# Patient Record
Sex: Female | Born: 1945 | Race: White | Hispanic: No | Marital: Married | Smoking: Never smoker
Health system: Southern US, Community
[De-identification: ages and names within clinical notes are randomized; demographics above are authoritative.]

## PROBLEM LIST (undated history)

## (undated) DIAGNOSIS — G309 Alzheimer's disease, unspecified: Secondary | ICD-10-CM

## (undated) DIAGNOSIS — F0280 Dementia in other diseases classified elsewhere without behavioral disturbance: Secondary | ICD-10-CM

## (undated) DIAGNOSIS — M810 Age-related osteoporosis without current pathological fracture: Secondary | ICD-10-CM

## (undated) DIAGNOSIS — R413 Other amnesia: Secondary | ICD-10-CM

## (undated) HISTORY — DX: Dementia in other diseases classified elsewhere without behavioral disturbance: F02.80

## (undated) HISTORY — PX: NO PAST SURGERIES: SHX2092

## (undated) HISTORY — DX: Alzheimer's disease, unspecified: G30.9

## (undated) HISTORY — DX: Age-related osteoporosis without current pathological fracture: M81.0

## (undated) HISTORY — DX: Other amnesia: R41.3

---

## 1999-05-24 ENCOUNTER — Other Ambulatory Visit: Admission: RE | Admit: 1999-05-24 | Discharge: 1999-05-24 | Payer: Self-pay | Admitting: Family Medicine

## 1999-06-21 ENCOUNTER — Encounter: Admission: RE | Admit: 1999-06-21 | Discharge: 1999-06-21 | Payer: Self-pay | Admitting: Family Medicine

## 1999-06-21 ENCOUNTER — Encounter: Payer: Self-pay | Admitting: Family Medicine

## 1999-06-24 ENCOUNTER — Encounter: Payer: Self-pay | Admitting: Family Medicine

## 1999-06-24 ENCOUNTER — Encounter: Admission: RE | Admit: 1999-06-24 | Discharge: 1999-06-24 | Payer: Self-pay | Admitting: Family Medicine

## 2000-02-11 ENCOUNTER — Encounter: Admission: RE | Admit: 2000-02-11 | Discharge: 2000-02-11 | Payer: Self-pay | Admitting: Family Medicine

## 2000-02-11 ENCOUNTER — Encounter (INDEPENDENT_AMBULATORY_CARE_PROVIDER_SITE_OTHER): Payer: Self-pay | Admitting: *Deleted

## 2000-02-11 ENCOUNTER — Other Ambulatory Visit: Admission: RE | Admit: 2000-02-11 | Discharge: 2000-02-11 | Payer: Self-pay | Admitting: Radiology

## 2000-02-11 ENCOUNTER — Encounter: Payer: Self-pay | Admitting: Family Medicine

## 2000-08-08 ENCOUNTER — Other Ambulatory Visit: Admission: RE | Admit: 2000-08-08 | Discharge: 2000-08-08 | Payer: Self-pay | Admitting: Family Medicine

## 2000-08-10 ENCOUNTER — Encounter: Admission: RE | Admit: 2000-08-10 | Discharge: 2000-08-10 | Payer: Self-pay | Admitting: Family Medicine

## 2000-08-10 ENCOUNTER — Encounter: Payer: Self-pay | Admitting: Family Medicine

## 2000-11-10 ENCOUNTER — Ambulatory Visit (HOSPITAL_COMMUNITY): Admission: RE | Admit: 2000-11-10 | Discharge: 2000-11-10 | Payer: Self-pay | Admitting: Gastroenterology

## 2001-09-26 ENCOUNTER — Other Ambulatory Visit: Admission: RE | Admit: 2001-09-26 | Discharge: 2001-09-26 | Payer: Self-pay | Admitting: Family Medicine

## 2001-10-01 ENCOUNTER — Encounter: Admission: RE | Admit: 2001-10-01 | Discharge: 2001-10-01 | Payer: Self-pay | Admitting: Family Medicine

## 2001-10-01 ENCOUNTER — Encounter: Payer: Self-pay | Admitting: Family Medicine

## 2001-10-04 ENCOUNTER — Encounter: Payer: Self-pay | Admitting: Family Medicine

## 2001-10-04 ENCOUNTER — Encounter: Admission: RE | Admit: 2001-10-04 | Discharge: 2001-10-04 | Payer: Self-pay | Admitting: Family Medicine

## 2002-10-09 ENCOUNTER — Encounter: Payer: Self-pay | Admitting: Family Medicine

## 2002-10-09 ENCOUNTER — Encounter: Admission: RE | Admit: 2002-10-09 | Discharge: 2002-10-09 | Payer: Self-pay | Admitting: Family Medicine

## 2002-10-25 ENCOUNTER — Other Ambulatory Visit: Admission: RE | Admit: 2002-10-25 | Discharge: 2002-10-25 | Payer: Self-pay | Admitting: Family Medicine

## 2003-01-30 ENCOUNTER — Ambulatory Visit (HOSPITAL_COMMUNITY): Admission: RE | Admit: 2003-01-30 | Discharge: 2003-01-30 | Payer: Self-pay | Admitting: Gastroenterology

## 2003-01-30 ENCOUNTER — Encounter (INDEPENDENT_AMBULATORY_CARE_PROVIDER_SITE_OTHER): Payer: Self-pay | Admitting: Specialist

## 2003-10-10 ENCOUNTER — Ambulatory Visit (HOSPITAL_COMMUNITY): Admission: RE | Admit: 2003-10-10 | Discharge: 2003-10-10 | Payer: Self-pay | Admitting: Family Medicine

## 2003-11-25 ENCOUNTER — Other Ambulatory Visit: Admission: RE | Admit: 2003-11-25 | Discharge: 2003-11-25 | Payer: Self-pay | Admitting: Family Medicine

## 2004-10-28 ENCOUNTER — Ambulatory Visit (HOSPITAL_COMMUNITY): Admission: RE | Admit: 2004-10-28 | Discharge: 2004-10-28 | Payer: Self-pay | Admitting: Family Medicine

## 2005-11-04 ENCOUNTER — Ambulatory Visit (HOSPITAL_COMMUNITY): Admission: RE | Admit: 2005-11-04 | Discharge: 2005-11-04 | Payer: Self-pay | Admitting: Family Medicine

## 2006-03-16 ENCOUNTER — Other Ambulatory Visit: Admission: RE | Admit: 2006-03-16 | Discharge: 2006-03-16 | Payer: Self-pay | Admitting: Family Medicine

## 2006-11-15 ENCOUNTER — Ambulatory Visit (HOSPITAL_COMMUNITY): Admission: RE | Admit: 2006-11-15 | Discharge: 2006-11-15 | Payer: Self-pay | Admitting: Family Medicine

## 2007-11-23 ENCOUNTER — Ambulatory Visit (HOSPITAL_COMMUNITY): Admission: RE | Admit: 2007-11-23 | Discharge: 2007-11-23 | Payer: Self-pay | Admitting: Family Medicine

## 2008-12-01 ENCOUNTER — Encounter: Admission: RE | Admit: 2008-12-01 | Discharge: 2008-12-01 | Payer: Self-pay | Admitting: Family Medicine

## 2009-12-04 ENCOUNTER — Ambulatory Visit (HOSPITAL_COMMUNITY): Admission: RE | Admit: 2009-12-04 | Discharge: 2009-12-04 | Payer: Self-pay | Admitting: Family Medicine

## 2010-04-26 ENCOUNTER — Encounter: Payer: Self-pay | Admitting: Family Medicine

## 2010-08-20 NOTE — Op Note (Signed)
NAMEJENEVA, Sheri Lucas                            ACCOUNT NO.:  [id number]   MEDICAL RECORD NO.:  [id number]                   PATIENT TYPE:  AMB   LOCATION:  ENDO                                 FACILITY:  [facility]   PHYSICIAN:  Petra Kuba, M.D.                 DATE OF BIRTH:  Jul 13, 1945   DATE OF PROCEDURE:  01/30/2003  DATE OF DISCHARGE:                                 OPERATIVE REPORT   PROCEDURE:  Colonoscopy with biopsy.   INDICATIONS FOR PROCEDURE:  Guaiac positivity, family history of colon  cancer not quite due for colonic screening.   Consent was signed after risks, benefits, methods, and options were  thoroughly discussed in the office on multiple occasions.   MEDICINES USED:  Demerol 60, Versed 6.   DESCRIPTION OF PROCEDURE:  Rectal inspection was pertinent for small  external hemorrhoids. Digital exam was negative. The pediatric video  adjustable colonoscope was inserted, easily advanced around the colon to the  cecum. This did require some abdominal pressure but no position changes. No  obvious abnormality was seen on insertion. The cecum was identified by the  appendiceal orifice and the ileocecal valve. In fact, the scope was inserted  a short ways into the terminal ileum which was normal. Photo documentation  was obtained. No blood was seen on insertion or coming from above. The scope  was slowly withdrawn.  The prep was adequate. There was some liquid stool  that required washing and suctioning. In the cecal pole along the pole the  tiny 1-2 mm probably hyperplastic appearing polyp was seen and was cold  biopsied x2.  On slow withdrawal through the colon, no other abnormalities  were seen including no blood, diverticula, AVMs or polyps.  Anal rectal pull  through and retroflexion did confirm small hemorrhoids. The scope was  reinserted a short ways up the left side of the colon, air was suctioned,  scope removed. The patient tolerated the procedure well. There was  no  obvious or immediate complications.   ENDOSCOPIC DIAGNOSIS:  1. Internal and external hemorrhoids.  2. No blood seen.  3. Tiny cecal polyp cold biopsied.  4. Otherwise within normal limits to the terminal ileum.    PLAN:  Await pathology. Would probably recheck colon screening in five  years. Will go ahead and plan to treat her hemorrhoids and repeat guaiacs in  one month and if positive continue workup with an upper GI small bowel  follow through and a CBC; however, if negative probably no further workup.  Return care to Dr. Idell Pickles and be on standby to help p.r.n.                                               Petra Kuba, M.D.  MEM/MEDQ  D:  01/30/2003  T:  01/30/2003  Job:  [id number]   cc:   Dellis Anes. Idell Pickles, M.D.  [address]  [place]  [place] [address]  Fax: [phone]

## 2010-08-20 NOTE — Procedures (Signed)
[facility]  Patient:    Sheri Lucas, Sheri Lucas                       MRN: [id number] Proc. Date: 11/10/00 Adm. Date:  [id number] Attending:  Nelda Marseille CC:         Dellis Anes. Idell Pickles, M.D.   Procedure Report  PROCEDURE:  Colonoscopy.  INDICATIONS:  Family history of colon cancer.  PROCEDURE PERFORMED:  Consent was signed after the risks, benefits, methods, options were thoroughly discussed in the office.  MEDICINE USED:  Demerol 40 and Versed 4.  PROCEDURE: Rectal inspection was pertinent for external hemorrhoids.  Small digital exam was negative.  The video pediatric colonoscope was inserted and advanced around the colon to the cecum.  This did require some left lower quadrant pressure but no position changes.  The cecum was identified by the appendiceal orifice and the ileocecal valve.  No obvious abnormality was seen on insertion.  The prep was adequate.  There was some liquid stool that required washing and sectioning, but on slow withdrawal through the colon, no polyps, masses, diverticula, or other abnormalities were seen as we slowly withdrew back to the rectum.  Once back in the rectum, the scope was then retroflexed and pertinent for some internal hemorrhoids.  The scope was drained.  The air was withdrawn, and the scope was removed.  The patient tolerated the procedure well.  There was no obvious immediate complication.  ENDOSCOPIC DIAGNOSES: 1. Internal and external hemorrhoids. 2. Otherwise within normal limits to the cecum.  PLAN: Yearly rectals and guaiacs per Dellis Anes. Idell Pickles, M.D.  Happy to see back p.r.n. Otherwise, repeat colon screening in five years. DD:  11/10/00 TD:  11/11/00 Job: [id number]

## 2010-12-28 ENCOUNTER — Other Ambulatory Visit (HOSPITAL_COMMUNITY): Payer: Self-pay | Admitting: Family Medicine

## 2010-12-28 DIAGNOSIS — Z1231 Encounter for screening mammogram for malignant neoplasm of breast: Secondary | ICD-10-CM

## 2011-01-07 ENCOUNTER — Ambulatory Visit (HOSPITAL_COMMUNITY)
Admission: RE | Admit: 2011-01-07 | Discharge: 2011-01-07 | Disposition: A | Discharge status: Home / Self Care | Payer: 59 | Source: Ambulatory Visit | Attending: Family Medicine | Admitting: Family Medicine

## 2011-01-07 DIAGNOSIS — Z1231 Encounter for screening mammogram for malignant neoplasm of breast: Secondary | ICD-10-CM

## 2011-12-30 ENCOUNTER — Other Ambulatory Visit (HOSPITAL_COMMUNITY): Payer: Self-pay | Admitting: Family Medicine

## 2011-12-30 DIAGNOSIS — Z1231 Encounter for screening mammogram for malignant neoplasm of breast: Secondary | ICD-10-CM

## 2012-01-20 ENCOUNTER — Ambulatory Visit (HOSPITAL_COMMUNITY)
Admission: RE | Admit: 2012-01-20 | Discharge: 2012-01-20 | Disposition: A | Discharge status: Home / Self Care | Payer: Medicare PPO | Source: Ambulatory Visit | Attending: Family Medicine | Admitting: Family Medicine

## 2012-01-20 DIAGNOSIS — Z1231 Encounter for screening mammogram for malignant neoplasm of breast: Secondary | ICD-10-CM | POA: Insufficient documentation

## 2012-12-28 ENCOUNTER — Other Ambulatory Visit (HOSPITAL_COMMUNITY): Payer: Self-pay | Admitting: Diagnostic Radiology

## 2012-12-28 DIAGNOSIS — Z1231 Encounter for screening mammogram for malignant neoplasm of breast: Secondary | ICD-10-CM

## 2013-01-25 ENCOUNTER — Ambulatory Visit (HOSPITAL_COMMUNITY)
Admission: RE | Admit: 2013-01-25 | Discharge: 2013-01-25 | Disposition: A | Discharge status: Home / Self Care | Payer: Medicare PPO | Source: Ambulatory Visit | Attending: Diagnostic Radiology | Admitting: Diagnostic Radiology

## 2013-01-25 ENCOUNTER — Other Ambulatory Visit (HOSPITAL_COMMUNITY): Payer: Self-pay | Admitting: Diagnostic Radiology

## 2013-01-25 DIAGNOSIS — Z1231 Encounter for screening mammogram for malignant neoplasm of breast: Secondary | ICD-10-CM

## 2014-01-03 ENCOUNTER — Other Ambulatory Visit (HOSPITAL_COMMUNITY): Payer: Self-pay | Admitting: Family Medicine

## 2014-01-03 DIAGNOSIS — Z1231 Encounter for screening mammogram for malignant neoplasm of breast: Secondary | ICD-10-CM

## 2014-01-28 ENCOUNTER — Ambulatory Visit (HOSPITAL_COMMUNITY)
Admission: RE | Admit: 2014-01-28 | Discharge: 2014-01-28 | Disposition: A | Discharge status: Home / Self Care | Payer: Medicare PPO | Source: Ambulatory Visit | Attending: Family Medicine | Admitting: Family Medicine

## 2014-01-28 DIAGNOSIS — Z1231 Encounter for screening mammogram for malignant neoplasm of breast: Secondary | ICD-10-CM | POA: Insufficient documentation

## 2015-01-13 ENCOUNTER — Ambulatory Visit (INDEPENDENT_AMBULATORY_CARE_PROVIDER_SITE_OTHER): Payer: Medicare Other | Admitting: Neurology

## 2015-01-13 ENCOUNTER — Encounter: Payer: Self-pay | Admitting: Neurology

## 2015-01-13 VITALS — BP 153/81 | HR 85 | Ht 61.0 in | Wt 104.5 lb

## 2015-01-13 DIAGNOSIS — R413 Other amnesia: Secondary | ICD-10-CM

## 2015-01-13 HISTORY — DX: Other amnesia: R41.3

## 2015-01-13 MED ORDER — DONEPEZIL HCL 5 MG PO TABS: 5.0000 mg | ORAL_TABLET | Freq: Every day | ORAL | Status: DC

## 2015-01-13 NOTE — Progress Notes (Signed)
Reason for visit: Memory disorder  Referring physician: Dr. Rhae Hammock Sheri Lucas is a 69 y.o. female  History of present illness:  Sheri Lucas is a 69 year old right-handed white female with a history of a memory disorder that began about 18 months ago. The patient indicates that the memory issues were first noted shortly after she retired. She was not having memory problems while working. She has had some issues with short-term memory, she denies any issues with remembering names for people or things. She is having a lot of problems with math functions and numbers. She has had to get some help balancing her checkbook over the last 6-8 months. She is still operating a motor vehicle, she denies any issues with directions or with safety while driving. Her husband concurs with this. The patient is able to keep up with medications and appointments. She denies any balance issues or problems controlling the bowels or the bladder. She denies any numbness or weakness of the face, arms, or legs. She denies any depression problems or fatigue. She denies any family history of memory problems with exception that her father may have had some mild memory problems as he got older. The patient has had blood work done that includes a thyroid profile, and a vitamin B12 level that was unremarkable.  Past Medical History  Diagnosis Date  . Osteoporosis   . Memory deficits 01/13/2015    History reviewed. No pertinent past surgical history.  Family History  Problem Relation Age of Onset  . Heart attack Father   . Pancreatic cancer Brother     Social history:  reports that she has never smoked. She has never used smokeless tobacco. She reports that she does not drink alcohol or use illicit drugs.  Medications:  Prior to Admission medications   Medication Sig Start Date End Date Taking? Authorizing Provider  Calcium Carbonate-Vit D-Min (CALCIUM/VITAMIN D/MINERALS) 600-200 MG-UNIT TABS Take 1 tablet by  mouth daily.   Yes Historical Provider, MD  Multiple Vitamin (MULTIVITAMIN) tablet Take 1 tablet by mouth daily.   Yes Historical Provider, MD      Allergies  Allergen Reactions  . Amoxicillin   . Keflex [Cephalexin]     ROS:  Out of a complete 14 system review of symptoms, the patient complains only of the following symptoms, and all other reviewed systems are negative.  Memory disturbance, confusion  Blood pressure 153/81, pulse 85, height  (1.549 m), weight 104 lb 8 oz (47.401 kg).  Physical Exam  General: The patient is alert and cooperative at the time of the examination.  Eyes: Pupils are equal, round, and reactive to light. Discs are flat bilaterally.  Neck: The neck is supple, no carotid bruits are noted.  Respiratory: The respiratory examination is clear.  Cardiovascular: The cardiovascular examination reveals a regular rate and rhythm, no obvious murmurs or rubs are noted.  Skin: Extremities are without significant edema.  Neurologic Exam  Mental status: The patient is alert and oriented x 2 at the time of the examination (the patient is not oriented to date). The Mini-Mental Status Examination done today shows a total score 21/30. The patient is able to name 4 animals in 30 seconds.  Cranial nerves: Facial symmetry is present. There is good sensation of the face to pinprick and soft touch bilaterally. The strength of the facial muscles and the muscles to head turning and shoulder shrug are normal bilaterally. Speech is well enunciated, no aphasia or dysarthria is noted.  Extraocular movements are full. Visual fields are full. The tongue is midline, and the patient has symmetric elevation of the soft palate. No obvious hearing deficits are noted.  Motor: The motor testing reveals 5 over 5 strength of all 4 extremities. Good symmetric motor tone is noted throughout.  Sensory: Sensory testing is intact to pinprick, soft touch, vibration sensation, and position  sense on all 4 extremities. No evidence of extinction is noted.  Coordination: Cerebellar testing reveals good finger-nose-finger and heel-to-shin bilaterally. The patient has apraxia his with use of the arms and the legs.  Gait and station: Gait is normal. Tandem gait is normal. Romberg is negative. No drift is seen.  Reflexes: Deep tendon reflexes are symmetric and normal bilaterally. Toes are downgoing bilaterally.   Assessment/Plan:  1. Progressive memory disturbance  The patient likely has been developing an Alzheimer's disease process. 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 She has had blood work done that has been unremarkable, she will be sent for MRI evaluation of the brain. The husband asked about her driving, she has not had any issues with safety so far, but this issue needs to be scrutinized closely, the patient will need to refrain from driving as soon as problems are noted. The patient will follow-up in about 6 months, sooner if needed. We will start Aricept therapy now.  Marlan Palau MD 01/13/2015 6:56 PM  [name] [address] [place], [place] [address]  Phone [phone] Fax [phone]

## 2015-01-13 NOTE — Patient Instructions (Signed)
   Begin Aricept (donepezil) at 5 mg at night for one month. If this medication is well-tolerated, please call our office and we will call in a prescription for the 10 mg tablets. Look out for side effects that may include nausea, diarrhea, weight loss, or stomach cramps. This medication will also cause a runny nose, therefore there is no need for allergy medications for this purpose.  

## 2015-01-25 ENCOUNTER — Ambulatory Visit
Admission: RE | Admit: 2015-01-25 | Discharge: 2015-01-25 | Disposition: A | Discharge status: Home / Self Care | Payer: Medicare Other | Source: Ambulatory Visit | Attending: Neurology | Admitting: Neurology

## 2015-01-25 DIAGNOSIS — R413 Other amnesia: Secondary | ICD-10-CM

## 2015-01-26 ENCOUNTER — Telehealth: Payer: Self-pay | Admitting: Neurology

## 2015-01-26 NOTE — Telephone Encounter (Signed)
I called the patient, talk with the husband as well. MRI shows some atrophy, very minimal small vessel disease. The patient is on Aricept at 5 mg, tolerating this well, they will call me when she has been on this one month, we will go up to the 10 mg dose.   MRI brain 01/26/2015:  IMPRESSION: This is an abnormal MRI of the brain without contrast showing the following: 1. Moderate cortical atrophy that is most pronounced in the mesial temporal lobes and the parietal lobes. 2. A few scattered T2/FLAIR hyperintense foci, consistent with age appropriate minimal chronic microvascular ischemic change 3. There are no acute findings.

## 2015-02-09 ENCOUNTER — Telehealth: Payer: Self-pay | Admitting: Neurology

## 2015-02-09 MED ORDER — DONEPEZIL HCL 10 MG PO TABS: 10.0000 mg | ORAL_TABLET | Freq: Every day | ORAL | Status: DC

## 2015-02-09 NOTE — Telephone Encounter (Signed)
Pt's husband called sts she is doing well on donepezil (ARICEPT) 5 MG tablet and sts Dr Anne HahnWillis advised could go up on dose if she was tolerating it. [facility].

## 2015-02-09 NOTE — Telephone Encounter (Signed)
Per last OV note: Begin Aricept (donepezil) at 5 mg at night for one month. If this medication is well-tolerated, please call our office and we will call in a prescription for the 10 mg tablets.  Rx has been updated and sent.

## 2015-02-13 ENCOUNTER — Other Ambulatory Visit: Payer: Self-pay

## 2015-02-13 MED ORDER — DONEPEZIL HCL 10 MG PO TABS: 10.0000 mg | ORAL_TABLET | Freq: Every day | ORAL | Status: DC

## 2015-03-09 ENCOUNTER — Telehealth: Payer: Self-pay | Admitting: Neurology

## 2015-03-09 ENCOUNTER — Encounter: Payer: Self-pay | Admitting: Neurology

## 2015-03-09 NOTE — Telephone Encounter (Signed)
The doctors prescribed Aricept 5mg  and then went to a 10 mg dosage. Does the dosage need to be increased when refilled. Also the patient got a summons for jury duty on Janauary 10th and needs a note to release her from this.  Thanks!  Please call.

## 2015-03-09 NOTE — Telephone Encounter (Signed)
I called the patient's husband. I advised that the patient will remain on 10 mg of Aricept as long as she is tolerating it well. He stated that she is tolerating it fine. I also advised that I would send this message to Dr. Anne HahnWillis for the jury duty letter and we would call him back.

## 2015-03-09 NOTE — Telephone Encounter (Signed)
I called the patient, talk with her husband. The patient is to remain on 10 mg of Aricept, I'll write a note for the jury duty exclusion.

## 2015-04-09 ENCOUNTER — Other Ambulatory Visit: Payer: Self-pay

## 2015-04-09 DIAGNOSIS — Z1231 Encounter for screening mammogram for malignant neoplasm of breast: Secondary | ICD-10-CM

## 2015-04-28 ENCOUNTER — Ambulatory Visit
Admission: RE | Admit: 2015-04-28 | Discharge: 2015-04-28 | Disposition: A | Discharge status: Home / Self Care | Payer: Medicare Other | Source: Ambulatory Visit

## 2015-04-28 DIAGNOSIS — Z1231 Encounter for screening mammogram for malignant neoplasm of breast: Secondary | ICD-10-CM

## 2015-06-16 ENCOUNTER — Other Ambulatory Visit: Payer: Self-pay | Admitting: Neurology

## 2015-07-20 ENCOUNTER — Encounter: Payer: Self-pay | Admitting: Neurology

## 2015-07-20 ENCOUNTER — Ambulatory Visit (INDEPENDENT_AMBULATORY_CARE_PROVIDER_SITE_OTHER): Payer: Medicare Other | Admitting: Neurology

## 2015-07-20 VITALS — BP 140/75 | HR 65 | Ht 61.0 in | Wt 103.5 lb

## 2015-07-20 DIAGNOSIS — R413 Other amnesia: Secondary | ICD-10-CM

## 2015-07-20 NOTE — Progress Notes (Signed)
    Reason for visit: Memory disorder  Sheri Lucas is an 70 y.o. female  History of present illness:  Sheri Lucas is a 70 year old right-handed white female with a history of a memory disorder. The patient has been placed on Aricept and she is tolerating the medication well. She has had some minimal weight loss on the medication, but overall she is lost only 1 pound since her last visit. She is exercising on a regular basis. She has not given up any activities of daily living because of memory. She does have some word finding problems primarily. The patient returns to this office for an evaluation. She indicates that she is sleeping fairly well.  Past Medical History  Diagnosis Date  . Osteoporosis   . Memory deficits 01/13/2015    History reviewed. No pertinent past surgical history.  Family History  Problem Relation Age of Onset  . Heart attack Father   . Pancreatic cancer Brother     Social history:  reports that she has never smoked. She has never used smokeless tobacco. She reports that she does not drink alcohol or use illicit drugs.    Allergies  Allergen Reactions  . Amoxicillin   . Keflex [Cephalexin]     Medications:  Prior to Admission medications   Medication Sig Start Date End Date Taking? Authorizing Provider  Calcium Carbonate-Vit D-Min (CALCIUM/VITAMIN D/MINERALS) 600-200 MG-UNIT TABS Take 1 tablet by mouth daily.    Historical Provider, MD  donepezil (ARICEPT) 10 MG tablet TAKE 1 TABLET (10 MG TOTAL) BY MOUTH AT BEDTIME. 06/16/15   York Spanielharles K Willis, MD  Multiple Vitamin (MULTIVITAMIN) tablet Take 1 tablet by mouth daily.    Historical Provider, MD    ROS:  Out of a complete 14 system review of symptoms, the patient complains only of the following symptoms, and all other reviewed systems are negative.  Memory disturbance  Blood pressure 140/75, pulse 65, height 5\' 1"  (1.549 m), weight 103 lb 8 oz (46.947 kg).  Physical Exam  General: The patient is  alert and cooperative at the time of the examination.  Skin: No significant peripheral edema is noted.   Neurologic Exam  Mental status: The patient is alert and oriented x 2 at the time of the examination (not oriented to date). The Mini-Mental Status Examination done today shows a total score of 26/30. The patient is able to name 9 animals in 30 seconds.   Cranial nerves: Facial symmetry is present. Speech is normal, no aphasia or dysarthria is noted. Extraocular movements are full. Visual fields are full.  Motor: The patient has good strength in all 4 extremities.  Sensory examination: Soft touch sensation is symmetric on the face, arms, and legs.  Coordination: The patient has good finger-nose-finger and heel-to-shin bilaterally.  Gait and station: The patient has a normal gait. Tandem gait is normal. Romberg is negative. No drift is seen.  Reflexes: Deep tendon reflexes are symmetric.   Assessment/Plan:  1. Memory disturbance  The patient is tolerating the Aricept fairly well. She will continue on the medication. The patient is interested in learning about research programs for memory issues. I will have our research coordinator to contact her. She will follow-up in 6 or 7 months.  Marlan Palau. Keith Willis MD 07/20/2015 8:09 PM  [name] [address] [place], [place] [id number]  Phone [phone] Fax [phone]

## 2015-10-02 ENCOUNTER — Telehealth: Payer: Self-pay | Admitting: *Deleted

## 2015-10-02 MED ORDER — DONEPEZIL HCL 10 MG PO TABS: 10.0000 mg | ORAL_TABLET | Freq: Every day | ORAL | Status: DC

## 2015-10-02 NOTE — Telephone Encounter (Signed)
[facility] day rx. [facility]fim

## 2016-02-22 ENCOUNTER — Ambulatory Visit (INDEPENDENT_AMBULATORY_CARE_PROVIDER_SITE_OTHER): Payer: Medicare Other | Admitting: Neurology

## 2016-02-22 ENCOUNTER — Encounter: Payer: Self-pay | Admitting: Neurology

## 2016-02-22 VITALS — BP 161/84 | HR 72 | Ht 61.0 in | Wt 104.2 lb

## 2016-02-22 DIAGNOSIS — R413 Other amnesia: Secondary | ICD-10-CM

## 2016-02-22 MED ORDER — MEMANTINE HCL 28 X 5 MG & 21 X 10 MG PO TABS: ORAL_TABLET | ORAL | 0 refills | Status: DC

## 2016-02-22 NOTE — Patient Instructions (Signed)
    Call in one month if the Namenda is tolerated. We will call in 90 day Rx for the medication at that time.

## 2016-02-22 NOTE — Progress Notes (Signed)
Reason for visit: Memory disturbance  Sheri Lucas is an 70 y.o. female  History of present illness:  Sheri Lucas is a 70 year old right-handed white female with a history of a progressive memory disturbance consisting with Alzheimer's disease. The patient is on Aricept, she is tolerating the medication well, she is not losing weight. She is sleeping fairly well at night. She is no longer operating a motor vehicle. She still does a lot of housework and yard work. She denies any other new medical issues that have come up since last seen. She is involved with exercising through the [employer] program. The patient returns to this office for an evaluation.   Past Medical History:  Diagnosis Date  . Memory deficits 01/13/2015  . Osteoporosis     Past Surgical History:  Procedure Laterality Date  . NO PAST SURGERIES      Family History  Problem Relation Age of Onset  . Heart attack Father   . Pancreatic cancer Brother     Social history:  reports that she has never smoked. She has never used smokeless tobacco. She reports that she does not drink alcohol or use drugs.    Allergies  Allergen Reactions  . Amoxicillin   . Keflex [Cephalexin]     Medications:  Prior to Admission medications   Medication Sig Start Date End Date Taking? Authorizing Provider  Calcium Carbonate-Vit D-Min (CALCIUM/VITAMIN D/MINERALS) 600-200 MG-UNIT TABS Take 1 tablet by mouth daily.   Yes Historical Provider, MD  donepezil (ARICEPT) 10 MG tablet Take 1 tablet (10 mg total) by mouth at bedtime. 10/02/15  Yes York Spanielharles K Willis, MD  Multiple Vitamin (MULTIVITAMIN) tablet Take 1 tablet by mouth daily.   Yes Historical Provider, MD    ROS:  Out of a complete 14 system review of symptoms, the patient complains only of the following symptoms, and all other reviewed systems are negative.  Memory loss  Blood pressure (!) 161/84, pulse 72, height 5\' 1"  (1.549 m), weight 104 lb 4 oz (47.3  kg).  Physical Exam  General: The patient is alert and cooperative at the time of the examination.  Skin: No significant peripheral edema is noted.   Neurologic Exam  Mental status: The patient is alert and oriented x 2 at the time of the examination (not oriented to date). The Mini-Mental Status Examination done at this time shows a total score of 21/30..   Cranial nerves: Facial symmetry is present. Speech is normal, no aphasia or dysarthria is noted. Extraocular movements are full. Visual fields are full.  Motor: The patient has good strength in all 4 extremities.  Sensory examination: Soft touch sensation is symmetric on the face, arms, and legs.  Coordination: The patient has good finger-nose-finger and heel-to-shin bilaterally. Apraxia with the use of the extremities is noted.  Gait and station: The patient has a normal gait. Tandem gait is normal. Romberg is negative. No drift is seen.  Reflexes: Deep tendon reflexes are symmetric.   Assessment/Plan:  1. Progressive memory disturbance, Alzheimer's disease  The patient has shown an interest in clinical research, I will once again will contact our research coordinator regarding this patient. The patient will be placed on Namenda, she will be worked up on the dose. The patient is to contact our office if she is tolerating the drug after one month, I will call in a maintenance prescription for her. She will follow-up in 6 months.  Marlan Palau. Keith Willis MD 02/22/2016 2:50 PM  [name] [address] [place], [place] [address]  Phone [phone] Fax [phone]

## 2016-03-21 ENCOUNTER — Telehealth: Payer: Self-pay | Admitting: Neurology

## 2016-03-21 MED ORDER — MEMANTINE HCL 10 MG PO TABS: 10.0000 mg | ORAL_TABLET | Freq: Two times a day (BID) | ORAL | 5 refills | Status: DC

## 2016-03-21 NOTE — Telephone Encounter (Signed)
Returned call and spoke to pt's husband. He reports that pt has done well on memantine titration pack. However, it seems that she has been on Namenda XR as her husband says that she is now up to 28 mg per day. Would like refill as pt has 2 days of medication left.   States that he still has not heard from the research department. Will again forward pt/family interest on research study.

## 2016-03-21 NOTE — Telephone Encounter (Signed)
Patient's husband is calling to get a new Rx for Namenda. She has almost finished the titration pack and needs to discuss the new dosage. Please call and advise. [facility]. Also she was going to be put on a research study but has not heard from them,

## 2016-03-21 NOTE — Telephone Encounter (Signed)
I will refill the Namenda, I will switch them to generic 10 mg tablet taking one twice a day.

## 2016-03-21 NOTE — Addendum Note (Signed)
Addended by: Stephanie AcreWILLIS, Sharron Simpson on: 03/21/2016 05:46 PM   Modules accepted: Orders

## 2016-03-25 ENCOUNTER — Other Ambulatory Visit: Payer: Self-pay | Admitting: Physician Assistant

## 2016-03-25 DIAGNOSIS — Z1231 Encounter for screening mammogram for malignant neoplasm of breast: Secondary | ICD-10-CM

## 2016-04-12 ENCOUNTER — Other Ambulatory Visit: Payer: Self-pay

## 2016-04-12 MED ORDER — MEMANTINE HCL 10 MG PO TABS: 10.0000 mg | ORAL_TABLET | Freq: Two times a day (BID) | ORAL | 3 refills | Status: DC

## 2016-04-12 NOTE — Telephone Encounter (Signed)
90 day rx refills e-scribed per faxed request from pharmacy.

## 2016-04-13 NOTE — Telephone Encounter (Signed)
PA completed per faxed request from pharmacy. Memantine is on plan's list of covered drugs. PA is not required at this time per [facility] T # [phone].

## 2016-04-29 ENCOUNTER — Ambulatory Visit
Admission: RE | Admit: 2016-04-29 | Discharge: 2016-04-29 | Disposition: A | Discharge status: Home / Self Care | Payer: Medicare Other | Source: Ambulatory Visit | Attending: Physician Assistant | Admitting: Physician Assistant

## 2016-04-29 DIAGNOSIS — Z1231 Encounter for screening mammogram for malignant neoplasm of breast: Secondary | ICD-10-CM

## 2016-08-30 ENCOUNTER — Encounter: Payer: Self-pay | Admitting: Neurology

## 2016-08-30 ENCOUNTER — Ambulatory Visit (INDEPENDENT_AMBULATORY_CARE_PROVIDER_SITE_OTHER): Payer: Medicare Other | Admitting: Neurology

## 2016-08-30 VITALS — BP 148/68 | HR 64 | Ht 61.0 in | Wt 107.0 lb

## 2016-08-30 DIAGNOSIS — R413 Other amnesia: Secondary | ICD-10-CM

## 2016-08-30 MED ORDER — DONEPEZIL HCL 10 MG PO TABS: 10.0000 mg | ORAL_TABLET | Freq: Every day | ORAL | 3 refills | Status: DC

## 2016-08-30 NOTE — Progress Notes (Signed)
Reason for visit: Memory disturbance  Sheri Lucas is an 71 y.o. female  History of present illness:  Sheri Lucas is a 71 year old right-handed white female with a history of a progressive memory disturbance. The patient has not been operating a motor vehicle in greater than one year. She has not been cooking as much, her husband is taking over this responsibility. The patient does some yard work and some housework. She is sleeping fairly well at night. She is on Aricept and Namenda. She returns to this office for an evaluation. The patient does have some anxiety issues at times. She requires some help to keep up with medications and appointments.  Past Medical History:  Diagnosis Date  . Memory deficits 01/13/2015  . Osteoporosis     Past Surgical History:  Procedure Laterality Date  . NO PAST SURGERIES      Family History  Problem Relation Age of Onset  . Heart attack Father   . Pancreatic cancer Brother     Social history:  reports that she has never smoked. She has never used smokeless tobacco. She reports that she does not drink alcohol or use drugs.    Allergies  Allergen Reactions  . Amoxicillin   . Keflex [Cephalexin]     Medications:  Prior to Admission medications   Medication Sig Start Date End Date Taking? Authorizing Provider  Calcium Carbonate-Vit D-Min (CALCIUM/VITAMIN D/MINERALS) 600-200 MG-UNIT TABS Take 1 tablet by mouth daily.   Yes [provider]  donepezil (ARICEPT) 10 MG tablet Take 1 tablet (10 mg total) by mouth at bedtime. 10/02/15  Yes York Spaniel, MD  memantine (NAMENDA) 10 MG tablet Take 1 tablet (10 mg total) by mouth 2 (two) times daily. 04/12/16  Yes York Spaniel, MD  Multiple Vitamin (MULTIVITAMIN) tablet Take 1 tablet by mouth daily.   Yes [provider]  OVER THE COUNTER MEDICATION Take 2 capsules by mouth 2 (two) times daily. Phosphatidyl   Yes [provider]    ROS:  Out of a complete 14  system review of symptoms, the patient complains only of the following symptoms, and all other reviewed systems are negative.  Memory loss  Blood pressure (!) 148/68, pulse 64, height 5\' 1"  (1.549 m), weight 107 lb (48.5 kg).  Physical Exam  General: The patient is alert and cooperative at the time of the examination.  Skin: No significant peripheral edema is noted.   Neurologic Exam  Mental status: The patient is alert and oriented x 3 at the time of the examination. The Mini-Mental Status Examination done today shows a total score of 16/30.   Cranial nerves: Facial symmetry is present. Speech is normal, no aphasia or dysarthria is noted. Extraocular movements are full. Visual fields are full.  Motor: The patient has good strength in all 4 extremities.  Sensory examination: Soft touch sensation is symmetric on the face, arms, and legs.  Coordination: The patient has good finger-nose-finger and heel-to-shin bilaterally. Apraxia with the use of the extremities is noted.  Gait and station: The patient has a normal gait. Tandem gait is normal. Romberg is negative. No drift is seen.  Reflexes: Deep tendon reflexes are symmetric.   Assessment/Plan:  1. Progressive memory disturbance  The patient was being seen through our research department for potential involvement in memory research. I will try to find out if the patient is a candidate for any of the trials currently. The patient will continue Aricept and Namenda, she may  add coconut oil to the diet. She will follow-up in 6 months.  Sheri Lucas. Sheri Sophiah Rolin MD 08/30/2016 2:47 PM  [name] [address] [place], [place] [id number]  Phone [phone] Fax [phone]

## 2017-03-07 ENCOUNTER — Encounter: Payer: Self-pay | Admitting: Neurology

## 2017-03-07 ENCOUNTER — Ambulatory Visit: Payer: Medicare Other | Admitting: Neurology

## 2017-03-07 VITALS — BP 146/76 | HR 70 | Wt 109.0 lb

## 2017-03-07 DIAGNOSIS — F0280 Dementia in other diseases classified elsewhere without behavioral disturbance: Secondary | ICD-10-CM | POA: Diagnosis not present

## 2017-03-07 DIAGNOSIS — G309 Alzheimer's disease, unspecified: Secondary | ICD-10-CM

## 2017-03-07 DIAGNOSIS — R413 Other amnesia: Secondary | ICD-10-CM

## 2017-03-07 DIAGNOSIS — G300 Alzheimer's disease with early onset: Secondary | ICD-10-CM

## 2017-03-07 HISTORY — DX: Dementia in other diseases classified elsewhere, unspecified severity, without behavioral disturbance, psychotic disturbance, mood disturbance, and anxiety: F02.80

## 2017-03-07 MED ORDER — MEMANTINE HCL 10 MG PO TABS: 10.0000 mg | ORAL_TABLET | Freq: Two times a day (BID) | ORAL | 3 refills | Status: DC

## 2017-03-07 NOTE — Progress Notes (Signed)
Reason for visit: Alzheimer's disease  Sheri Lucas is an 71 y.o. female  History of present illness:  Ms. Sheri Lucas is a 71 year old right-handed white female with a history of a progressive memory disturbance consistent with Alzheimer's disease.  The patient has been on Aricept and Namenda, she lives with her husband who has been caring for her.  The patient is not cooking much at all, she will help with housework and yard work.  She is gradually having increasing problems with word finding.  She rests well at night, she has a good energy level during the day.  The patient is active, she is exercising on a regular basis, and she eats a vegetarian diet.  She is on Aricept and Namenda and tolerates the medications well.  Past Medical History:  Diagnosis Date  . Memory deficits 01/13/2015  . Osteoporosis     Past Surgical History:  Procedure Laterality Date  . NO PAST SURGERIES      Family History  Problem Relation Age of Onset  . Heart attack Father   . Pancreatic cancer Brother     Social history:  reports that  has never smoked. she has never used smokeless tobacco. She reports that she does not drink alcohol or use drugs.    Allergies  Allergen Reactions  . Amoxicillin   . Keflex [Cephalexin]     Medications:  Prior to Admission medications   Medication Sig Start Date End Date Taking? Authorizing Provider  Calcium Carbonate-Vit D-Min (CALCIUM/VITAMIN D/MINERALS) 600-200 MG-UNIT TABS Take 1 tablet by mouth daily.   Yes [provider]  donepezil (ARICEPT) 10 MG tablet Take 1 tablet (10 mg total) by mouth at bedtime. 08/30/16  Yes York SpanielWillis, Manson Luckadoo K, MD  memantine (NAMENDA) 10 MG tablet Take 1 tablet (10 mg total) by mouth 2 (two) times daily. 04/12/16  Yes York SpanielWillis, Nandana Krolikowski K, MD  Multiple Vitamin (MULTIVITAMIN) tablet Take 1 tablet by mouth daily.   Yes [provider]  OVER THE COUNTER MEDICATION Take 2 capsules by mouth 2 (two) times daily. Phosphatidyl    Yes [provider]    ROS:  Out of a complete 14 system review of symptoms, the patient complains only of the following symptoms, and all other reviewed systems are negative.  Memory loss, speech difficulty Anxiety  Blood pressure (!) 146/76, pulse 70, weight 109 lb (49.4 kg).  Physical Exam  General: The patient is alert and cooperative at the time of the examination.  Patient has a flat affect.  Skin: No significant peripheral edema is noted.   Neurologic Exam  Mental status: The patient is alert and oriented x 1 at the time of the examination (not oriented to place or date). The Mini-Mental status examination done today shows a total score of 10/30.   Cranial nerves: Facial symmetry is present. Speech is normal, no aphasia or dysarthria is noted. Extraocular movements are full. Visual fields are full.  Motor: The patient has good strength in all 4 extremities.  Sensory examination: Soft touch sensation is symmetric on the face, arms, and legs.  Coordination: The patient has good finger-nose-finger and heel-to-shin bilaterally.  Gait and station: The patient has a normal gait. Tandem gait is normal. Romberg is negative. No drift is seen.  Reflexes: Deep tendon reflexes are symmetric.   Assessment/Plan:  1.  Alzheimer's disease  The patient will continue the Aricept and Namenda for now.  A prescription for the Namenda was sent in.  The patient will  follow-up in 6 months.  She continues to progress slowly with her memory.  Marlan Palau. Keith Heberto Sturdevant MD 03/07/2017 2:40 PM  [name] [address] [place], [place] [id number]  Phone [phone] Fax [phone]

## 2017-04-06 ENCOUNTER — Other Ambulatory Visit: Payer: Self-pay | Admitting: Internal Medicine

## 2017-04-06 DIAGNOSIS — Z139 Encounter for screening, unspecified: Secondary | ICD-10-CM

## 2017-05-01 ENCOUNTER — Ambulatory Visit: Payer: Medicare Other

## 2017-05-02 ENCOUNTER — Ambulatory Visit
Admission: RE | Admit: 2017-05-02 | Discharge: 2017-05-02 | Disposition: A | Discharge status: Home / Self Care | Payer: Medicare Other | Source: Ambulatory Visit | Attending: Internal Medicine | Admitting: Internal Medicine

## 2017-05-02 DIAGNOSIS — Z139 Encounter for screening, unspecified: Secondary | ICD-10-CM

## 2017-09-06 ENCOUNTER — Encounter: Payer: Self-pay | Admitting: Neurology

## 2017-09-06 ENCOUNTER — Ambulatory Visit: Payer: Medicare Other | Admitting: Neurology

## 2017-09-06 ENCOUNTER — Telehealth: Payer: Self-pay | Admitting: Neurology

## 2017-09-06 VITALS — BP 120/67 | HR 79 | Ht 61.0 in | Wt 102.0 lb

## 2017-09-06 DIAGNOSIS — F0280 Dementia in other diseases classified elsewhere without behavioral disturbance: Secondary | ICD-10-CM | POA: Diagnosis not present

## 2017-09-06 DIAGNOSIS — G300 Alzheimer's disease with early onset: Secondary | ICD-10-CM | POA: Diagnosis not present

## 2017-09-06 MED ORDER — DONEPEZIL HCL 10 MG PO TABS: 10.0000 mg | ORAL_TABLET | Freq: Every day | ORAL | 3 refills | Status: DC

## 2017-09-06 NOTE — Telephone Encounter (Signed)
I called the patient, I indicated that the aid and assistance is not covered through his insurance, we had discussed this possibility in the office visit today.

## 2017-09-06 NOTE — Progress Notes (Signed)
Reason for visit: Alzheimer's disease  Sheri Lucas is an 72 y.o. female  History of present illness:  Sheri Lucas is a 72 year old right-handed white female with a history of a progressive dementing illness consistent with Alzheimer's disease.  The patient is seeing a holistic physician has placed her on hormone and steroid therapy as well, she is on Aricept and Namenda.  She is tolerating these medications well.  The patient sleeps well at night, she has good days and bad days with her dementia.  At times she needs some assistance with bathing and dressing.  She is able to feed herself, she is not able to operate a TV remote or a telephone.  She does not drive a car.  She does not have significant issues with agitation or hallucinations.  She returns for an evaluation.  The patient comes in with her husband who is her primary caretaker.  Past Medical History:  Diagnosis Date  . Alzheimer's disease 03/07/2017  . Memory deficits 01/13/2015  . Osteoporosis     Past Surgical History:  Procedure Laterality Date  . NO PAST SURGERIES      Family History  Problem Relation Age of Onset  . Heart attack Father   . Pancreatic cancer Brother     Social history:  reports that she has never smoked. She has never used smokeless tobacco. She reports that she does not drink alcohol or use drugs.    Allergies  Allergen Reactions  . Amoxicillin   . Keflex [Cephalexin]     Medications:  Prior to Admission medications   Medication Sig Start Date End Date Taking? Authorizing Provider  Calcium Carbonate-Vit D-Min (CALCIUM/VITAMIN D/MINERALS) 600-200 MG-UNIT TABS Take 1 tablet by mouth daily.   Yes [provider]  donepezil (ARICEPT) 10 MG tablet Take 1 tablet (10 mg total) by mouth at bedtime. 09/06/17  Yes York Spaniel, MD  memantine (NAMENDA) 10 MG tablet Take 1 tablet (10 mg total) by mouth 2 (two) times daily. 03/07/17  Yes York Spaniel, MD  Multiple Vitamin  (MULTIVITAMIN) tablet Take 1 tablet by mouth daily.   Yes [provider]  OVER THE COUNTER MEDICATION Take 2 capsules by mouth 2 (two) times daily. Phosphatidyl   Yes [provider]  Prasterone (DEHYDROEPIANDROSTERONE) POWD 1 capsule by Does not apply route daily. 2mg  capsule daily. Dennie Bible, MD prescribes this.   Yes [provider]  Progesterone Micronized (PROGESTERONE PO) Take 6.25 mg by mouth daily. Progresterone SR 6.25mg  capsule daily Dennie Bible, MD prescribes this.   Yes [provider]  UNABLE TO FIND Take 1 Dose by mouth daily. Med Name: pregnenolone SR 3.5 mg capsule Dennie Bible, MD prescribes this.   Yes [provider]    ROS:  Out of a complete 14 system review of symptoms, the patient complains only of the following symptoms, and all other reviewed systems are negative.  Memory loss Speech difficulty  Blood pressure 120/67, pulse 79, height 5\' 1"  (1.549 m), weight 102 lb (46.3 kg).  Physical Exam  General: The patient is alert and cooperative at the time of the examination.  Skin: No significant peripheral edema is noted.   Neurologic Exam  Mental status: The patient is alert and oriented x 1 at the time of the examination (the patient is oriented to person). The Mini-Mental status examination done today shows a total score of 10/30.   Cranial nerves: Facial symmetry is present. Speech is normal, no aphasia or  dysarthria is noted. Extraocular movements are full. Visual fields are full.  Motor: The patient has good strength in all 4 extremities.  Sensory examination: Soft touch sensation is symmetric on the face, arms, and legs.  Coordination: The patient has apraxia with finger-nose-finger and heel-to-shin bilaterally, no ataxia seen.  Gait and station: The patient has a slightly wide-based, stooped gait, the patient walk independently.  Romberg is negative.  Reflexes: Deep tendon reflexes are  symmetric.   Assessment/Plan:  1.  Alzheimer's disease  The patient will continue the Aricept and Namenda, a prescription was sent in for the Aricept today.  The husband is asked about aid and assistance in the home environment, I will try to get this set up but I am not sure if his insurance will cover this.  It is possible he may have to pay out-of-pocket.  I have suggested other possibilities such as the adult enrichment center that may allow him to carry out errands during the day without having to be with his wife.  The patient will follow-up in 6 months.   Marlan Palau. Keith Henna Derderian MD 09/06/2017 10:55 AM  [name] [address] [place], [place] [address]  Phone [phone] Fax [phone]

## 2018-01-04 ENCOUNTER — Telehealth: Payer: Self-pay | Admitting: Neurology

## 2018-01-04 DIAGNOSIS — R262 Difficulty in walking, not elsewhere classified: Secondary | ICD-10-CM

## 2018-01-04 NOTE — Telephone Encounter (Signed)
Absolutely! Please order thanks

## 2018-01-04 NOTE — Telephone Encounter (Signed)
Pt husband(on D[facility]-Rivi[facility],Richard G) has called stating pt has been having stiffness in her legs making it difficult to walk.  Pt husband states he has heard great things about Physical therapy and would like a call about a referral (pt husband is aware Dr Anne Hahn is out of office until next week)

## 2018-01-04 NOTE — Telephone Encounter (Signed)
I called husband back. He would like refer all to [facility] physical therapy. Also would like her to be eval for wheechair to use when going out of home to appt/ect. I placed referall. Ok per Dr. Lucia Gaskins.

## 2018-01-04 NOTE — Telephone Encounter (Signed)
Dr. Lucia Gaskins- are you okay with placing referral for Sheri Lucas for left stiffness/difficulty walking?  This is a Dr. Anne Hahn Sheri Lucas

## 2018-01-04 NOTE — Addendum Note (Signed)
Addended by: Hillis Range on: 01/04/2018 04:37 PM   Modules accepted: Orders

## 2018-03-13 ENCOUNTER — Other Ambulatory Visit: Payer: Self-pay | Admitting: Neurology

## 2018-03-19 NOTE — Progress Notes (Signed)
[name]  PATIENT: Sheri Lucas DOB: 04/09/1945   REASON FOR VISIT: Follow-up for progressive memory loss HISTORY FROM: Patient and husband    HISTORY OF PRESENT ILLNESS:UPDATE 12/17/2019CM Ms.Sheri Lucas, 72 year old female returns for follow-up with history of progressive dementing illness consistent with Alzheimer's disease.  She is currently on Aricept and Namenda tolerating both without side effects according to the husband.  Appetite is good she is sleeping okay she has good and bad days but mostly good days.  No falls.  She does not drive.  She can feed herself but requires some assistance with bathing and dressing.  She goes to yoga twice a week and she and her husband go to [employer].  She also walks for exercise.  She returns for reevaluation 6/5/19KWMs. Sheri Lucas is a 72 year old right-handed white female with a history of a progressive dementing illness consistent with Alzheimer's disease.  The patient is seeing a holistic physician has placed her on hormone and steroid therapy as well, she is on Aricept and Namenda.  She is tolerating these medications well.  The patient sleeps well at night, she has good days and bad days with her dementia.  At times she needs some assistance with bathing and dressing.  She is able to feed herself, she is not able to operate a TV remote or a telephone.  She does not drive a car.  She does not have significant issues with agitation or hallucinations.  She returns for an evaluation.  The patient comes in with her husband who is her primary caretaker.  REVIEW OF SYSTEMS: Full 14 system review of systems performed and notable only for those listed, all others are neg:  Constitutional: neg  Cardiovascular: neg Ear/Nose/Throat: neg  Skin: neg Eyes: neg Respiratory: neg Gastroitestinal: neg  Hematology/Lymphatic: neg  Endocrine: neg Musculoskeletal:neg Allergy/Immunology: neg Neurological: Progressive memory  loss Psychiatric: neg Sleep : neg   ALLERGIES: Allergies  Allergen Reactions  . Amoxicillin   . Keflex [Cephalexin]     HOME MEDICATIONS: Outpatient Medications Prior to Visit  Medication Sig Dispense Refill  . Calcium Carbonate-Vit D-Min (CALCIUM/VITAMIN D/MINERALS) 600-200 MG-UNIT TABS Take 1 tablet by mouth daily.    Marland Kitchen donepezil (ARICEPT) 10 MG tablet Take 1 tablet (10 mg total) by mouth at bedtime. 90 tablet 3  . memantine (NAMENDA) 10 MG tablet TAKE 1 TABLET BY MOUTH TWICE A DAY 180 tablet 3  . Multiple Vitamin (MULTIVITAMIN) tablet Take 1 tablet by mouth daily.    Marland Kitchen OVER THE COUNTER MEDICATION Take 2 capsules by mouth 2 (two) times daily. Phosphatidyl    . Prasterone (DEHYDROEPIANDROSTERONE) POWD 1 capsule by Does not apply route daily. 2mg  capsule daily. Dennie Bible, MD prescribes this.    . Progesterone Micronized (PROGESTERONE PO) Take 6.25 mg by mouth daily. Progresterone SR 6.25mg  capsule daily Dennie Bible, MD prescribes this.    Marland Kitchen UNABLE TO FIND Take 1 Dose by mouth daily. Med Name: pregnenolone SR 3.5 mg capsule Dennie Bible, MD prescribes this.     No facility-administered medications prior to visit.     PAST MEDICAL HISTORY: Past Medical History:  Diagnosis Date  . Alzheimer's disease (HCC) 03/07/2017  . Memory deficits 01/13/2015  . Osteoporosis     PAST SURGICAL HISTORY: Past Surgical History:  Procedure Laterality Date  . NO PAST SURGERIES      FAMILY HISTORY: Family History  Problem Relation Age of Onset  . Heart attack Father   . Pancreatic cancer Brother  SOCIAL HISTORY: Social History   Socioeconomic History  . Marital status: Married    Spouse name: Not on file  . Number of children: 2  . Years of education: masters  . Highest education level: Not on file  Occupational History  . Occupation: Retired    Comment: nutritionist   Social Needs  . Financial resource strain: Not on file  . Food insecurity:    Worry: Not  on file    Inability: Not on file  . Transportation needs:    Medical: Not on file    Non-medical: Not on file  Tobacco Use  . Smoking status: Never Smoker  . Smokeless tobacco: Never Used  Substance and Sexual Activity  . Alcohol use: No  . Drug use: No  . Sexual activity: Not on file  Lifestyle  . Physical activity:    Days per week: Not on file    Minutes per session: Not on file  . Stress: Not on file  Relationships  . Social connections:    Talks on phone: Not on file    Gets together: Not on file    Attends religious service: Not on file    Active member of club or organization: Not on file    Attends meetings of clubs or organizations: Not on file    Relationship status: Not on file  . Intimate partner violence:    Fear of current or ex partner: Not on file    Emotionally abused: Not on file    Physically abused: Not on file    Forced sexual activity: Not on file  Other Topics Concern  . Not on file  Social History Narrative   Lives at home w/ her husband, Sheri Lucas.   Patient drinks caffeine a few times a week.   Patient is right handed.      PHYSICAL EXAM  Vitals:   03/20/18 1028  BP: (!) 160/77  Pulse: 68  Weight: 103 lb 3.2 oz (46.8 kg)  Height: 5\' 1"  (1.549 m)   Body mass index is 19.5 kg/m.  Generalized: Well developed, in no acute distress  Head: normocephalic and atraumatic,. Oropharynx benign  Neck: Supple,  Musculoskeletal: No deformity   Neurological examination   Mentation: Alert oriented to time, place X1.  The patient is oriented to person.  She got extremely agitated when attempting to perform MMSE.  Last was 10 out of 30. Follows some  Commands, little speech ..   Cranial nerve II-XII: Pupils were equal round reactive to light extraocular movements were full, visual field were full on confrontational test. Facial sensation and strength were normal. hearing was intact to finger rubbing bilaterally. Uvula tongue midline. head turning and  shoulder shrug were normal and symmetric.Tongue protrusion into cheek strength was normal. Motor: normal bulk and tone, full strength in the BUE, BLE, Sensory: normal and symmetric to light touch, Coordination: Apraxia with finger-nose-finger, heel-to-shin bilaterally,  Reflexes: Symmetric upper and lower plantar responses were flexor bilaterally. Gait and Station: Rising up from seated position without assistance, wide-based stooped gait but she can walk independently no assistive device.  Romberg negative  DIAGNOSTIC DATA (LABS, IMAGING, TESTING) ASSESSMENT AND PLAN  72 y.o. year old female  has a past medical history of Alzheimer's disease (HCC) (03/07/2017), Memory deficits (01/13/2015), . here to follow-up for Alzheimer's disease     PLAN: Continue Aricept at current dose does not need refills Continue Namenda at current dose does not need refills Check with adult enrichment or senior  Services after the holidays for day programs F/U in 6 months Nilda Riggs, [facility], [facility], APRN  [facility] [address], [address] [place], [place] [address] [phone]

## 2018-03-20 ENCOUNTER — Encounter: Payer: Self-pay | Admitting: Nurse Practitioner

## 2018-03-20 ENCOUNTER — Ambulatory Visit: Payer: Medicare Other | Admitting: Nurse Practitioner

## 2018-03-20 VITALS — BP 160/77 | HR 68 | Ht 61.0 in | Wt 103.2 lb

## 2018-03-20 DIAGNOSIS — F0280 Dementia in other diseases classified elsewhere without behavioral disturbance: Secondary | ICD-10-CM

## 2018-03-20 DIAGNOSIS — G300 Alzheimer's disease with early onset: Secondary | ICD-10-CM | POA: Diagnosis not present

## 2018-03-20 NOTE — Progress Notes (Signed)
I have read the note, and I agree with the clinical assessment and plan.  Saje Gallop K Gayathri Futrell   

## 2018-03-20 NOTE — Patient Instructions (Signed)
Continue Aricept at current dose does not need refills Continue Namenda at current dose does not need refills Check with adult enrichment or senior  Services after the holidays F/U in 6 months

## 2018-04-02 ENCOUNTER — Other Ambulatory Visit: Payer: Self-pay | Admitting: Internal Medicine

## 2018-04-02 DIAGNOSIS — Z1231 Encounter for screening mammogram for malignant neoplasm of breast: Secondary | ICD-10-CM

## 2018-05-04 ENCOUNTER — Ambulatory Visit: Payer: Medicare Other

## 2018-08-26 ENCOUNTER — Other Ambulatory Visit: Payer: Self-pay | Admitting: Neurology

## 2018-10-12 ENCOUNTER — Ambulatory Visit: Payer: Medicare Other | Admitting: Neurology

## 2018-10-15 NOTE — Progress Notes (Signed)
PATIENT: Sheri Lucas DOB: 08/21/1945  REASON FOR VISIT: follow up HISTORY FROM: patient  HISTORY OF PRESENT ILLNESS: Today 10/16/18  Ms. Sheri Lucas is a 73 year old female with history of progressive dementing illness consistent with Alzheimer's disease.  She remains on Aricept and Namenda.  She is tolerating medication without side effect.  She lives with her husband, she does not drive. We were unable to complete her MMSE. Her last score was 10/30. Her mobility seems to come and go. She is in physical and occupational therapy. There has been improvement. It has given her some confidence. Her appetite is good, breakfast is her best meal. She requires some assistance with eating if it requires utensil. They have [facility], who came and provide a few hours of care so her husband can run out. He is able to get her into the car. She wears incontinence underwear. He does scheduled tolieting. No falls. In the past she was doing Geophysicist/field seismologistilver Sneakers. She is in wheelchair for transport, but she is able to stand and walk short distances. Sometimes she cries and gets upset easily. She sleeps okay. For the last several nights, she has been getting up around 2 am. Her husband does have locks on the home. She presents today for follow-up in wheelchair accompanied by her husband.   HISTORY  12/17/2019CM [name], 73 year old female returns for follow-up with history of progressive dementing illness consistent with Alzheimer's disease.  She is currently on Aricept and Namenda tolerating both without side effects according to the husband.  Appetite is good she is sleeping okay she has good and bad days but mostly good days.  No falls.  She does not drive.  She can feed herself but requires some assistance with bathing and dressing.  She goes to yoga twice a week and she and her husband go to [employer] sneakers.  She also walks for exercise.  She returns for reevaluation  REVIEW OF SYSTEMS: Out of a complete 14  system review of symptoms, the patient complains only of the following symptoms, and all other reviewed systems are negative.   Memory loss  ALLERGIES: Allergies  Allergen Reactions  . Amoxicillin   . Keflex [Cephalexin]     HOME MEDICATIONS: Outpatient Medications Prior to Visit  Medication Sig Dispense Refill  . Calcium Carbonate-Vit D-Min (CALCIUM/VITAMIN D/MINERALS) 600-200 MG-UNIT TABS Take 1 tablet by mouth daily.    . Multiple Vitamin (MULTIVITAMIN) tablet Take 1 tablet by mouth daily.    Marland Kitchen. OVER THE COUNTER MEDICATION Take 2 capsules by mouth 2 (two) times daily. Phosphatidyl    . Prasterone (DEHYDROEPIANDROSTERONE) POWD 1 capsule by Does not apply route daily. 2mg  capsule daily. Dennie BibleAlex Augoustides, MD prescribes this.    . Progesterone Micronized (PROGESTERONE PO) Take 6.25 mg by mouth daily. Progresterone SR 6.25mg  capsule daily Dennie BibleAlex Augoustides, MD prescribes this.    Marland Kitchen. UNABLE TO FIND Take 1 Dose by mouth daily. Med Name: pregnenolone SR 3.5 mg capsule Dennie BibleAlex Augoustides, MD prescribes this.    Marland Kitchen. donepezil (ARICEPT) 10 MG tablet TAKE 1 TABLET BY MOUTH EVERYDAY AT BEDTIME 90 tablet 3  . memantine (NAMENDA) 10 MG tablet TAKE 1 TABLET BY MOUTH TWICE A DAY 180 tablet 3   No facility-administered medications prior to visit.     PAST MEDICAL HISTORY: Past Medical History:  Diagnosis Date  . Alzheimer's disease (HCC) 03/07/2017  . Memory deficits 01/13/2015  . Osteoporosis     PAST SURGICAL HISTORY: Past Surgical History:  Procedure Laterality Date  .  NO PAST SURGERIES      FAMILY HISTORY: Family History  Problem Relation Age of Onset  . Heart attack Father   . Pancreatic cancer Brother     SOCIAL HISTORY: Social History   Socioeconomic History  . Marital status: Married    Spouse name: Not on file  . Number of children: 2  . Years of education: masters  . Highest education level: Not on file  Occupational History  . Occupation: Retired    Comment:  nutritionist   Social Needs  . Financial resource strain: Not on file  . Food insecurity    Worry: Not on file    Inability: Not on file  . Transportation needs    Medical: Not on file    Non-medical: Not on file  Tobacco Use  . Smoking status: Never Smoker  . Smokeless tobacco: Never Used  Substance and Sexual Activity  . Alcohol use: No  . Drug use: No  . Sexual activity: Not on file  Lifestyle  . Physical activity    Days per week: Not on file    Minutes per session: Not on file  . Stress: Not on file  Relationships  . Social Musicianconnections    Talks on phone: Not on file    Gets together: Not on file    Attends religious service: Not on file    Active member of club or organization: Not on file    Attends meetings of clubs or organizations: Not on file    Relationship status: Not on file  . Intimate partner violence    Fear of current or ex partner: Not on file    Emotionally abused: Not on file    Physically abused: Not on file    Forced sexual activity: Not on file  Other Topics Concern  . Not on file  Social History Narrative   Lives at home w/ her husband, Gerlene BurdockRichard.   Patient drinks caffeine a few times a week.   Patient is right handed.       PHYSICAL EXAM  Vitals:   10/16/18 0842  BP: 123/74  Pulse: 83  Temp: 97.8 F (36.6 C)  Weight: 103 lb (46.7 kg)  Height: 5\' 1"  (1.549 m)   Body mass index is 19.46 kg/m.  Generalized: Well developed, in no acute distress   Neurological examination  Mentation: Alert, history provided by husband, difficulty following commands for exam Cranial nerve II-XII: Pupils were equal round reactive to light. Extraocular movements were full, visual field were full on confrontational test. Facial sensation and strength were normal. Uvula tongue midline. Head turning and shoulder shrug  were normal and symmetric. Motor: Good strength in upper extremities, moves lower extremities  Sensory: Sensory testing is intact to soft  touch on all 4 extremities. No evidence of extinction is noted.  Coordination: Unable to perform Gait and station: In a wheelchair, able to stand for weight Reflexes: Deep tendon reflexes are symmetric decreased bilaterally  DIAGNOSTIC DATA (LABS, IMAGING, TESTING) - I reviewed patient records, labs, notes, testing and imaging myself where available.  No results found for: WBC, HGB, HCT, MCV, PLT No results found for: NA, K, CL, CO2, GLUCOSE, BUN, CREATININE, CALCIUM, PROT, ALBUMIN, AST, ALT, ALKPHOS, BILITOT, GFRNONAA, GFRAA No results found for: CHOL, HDL, LDLCALC, LDLDIRECT, TRIG, CHOLHDL No results found for: [id number] No results found for: VITAMINB12 No results found for: TSH    ASSESSMENT AND PLAN 73 y.o. year old female  has a past medical history  of Alzheimer's disease ([place]) (03/07/2017), Memory deficits (01/13/2015), and Osteoporosis. here with:  1. Memory disorder   She was not able to perform a MMSE. At this time, we will continue Aricept and Namenda.  Her weight has been stable.  She is undergoing physical therapy that has been beneficial, however her gait is limited.  Her husband reports for the last few nights she has been getting up in the middle of the night.  I have suggested they try melatonin at bedtime.  If her sleep does not improve, they will contact our office.  She does easily cry and get upset, or frustrated.  At this time, we will not add further medication, but the husband will let us know if this worsens.  Trazodone may be a good option in the future to help with sleep and emotions. Fortunately, they have in-home resources for assistance.  They will continue follow-up with primary care doctor.  They will follow-up in 6 months or sooner if needed.  I spent 15 minutes with the patient. 50% of this time was spent discussing her plan of care.   Butler Denmark, AGNP-C, DNP 10/16/2018, 10:28 AM [name] [address] [place], [place] [address]  [phone]

## 2018-10-16 ENCOUNTER — Encounter: Payer: Self-pay | Admitting: Neurology

## 2018-10-16 ENCOUNTER — Other Ambulatory Visit: Payer: Self-pay

## 2018-10-16 ENCOUNTER — Ambulatory Visit: Payer: Medicare Other | Admitting: Neurology

## 2018-10-16 VITALS — BP 123/74 | HR 83 | Temp 97.8°F | Ht 61.0 in | Wt 103.0 lb

## 2018-10-16 DIAGNOSIS — R413 Other amnesia: Secondary | ICD-10-CM | POA: Diagnosis not present

## 2018-10-16 MED ORDER — MEMANTINE HCL 10 MG PO TABS: 10.0000 mg | ORAL_TABLET | Freq: Two times a day (BID) | ORAL | 3 refills | Status: DC

## 2018-10-16 MED ORDER — DONEPEZIL HCL 10 MG PO TABS: ORAL_TABLET | ORAL | 3 refills | Status: DC

## 2018-10-16 NOTE — Patient Instructions (Signed)
You may consider the addition of Melatonin at night to help with sleeping.

## 2018-10-16 NOTE — Progress Notes (Signed)
I have read the note, and I agree with the clinical assessment and plan.  Lindell Tussey K Airica Schwartzkopf   

## 2019-04-18 ENCOUNTER — Ambulatory Visit: Payer: Medicare Other | Admitting: Neurology

## 2019-06-03 DIAGNOSIS — E860 Dehydration: Secondary | ICD-10-CM

## 2019-06-03 HISTORY — DX: Dehydration: E86.0

## 2019-06-07 ENCOUNTER — Inpatient Hospital Stay (HOSPITAL_COMMUNITY)
Admission: EM | Admit: 2019-06-07 | Discharge: 2019-06-13 | DRG: 640 | Disposition: A | Discharge status: Hospice | Payer: Medicare Other | Attending: Internal Medicine | Admitting: Internal Medicine

## 2019-06-07 ENCOUNTER — Other Ambulatory Visit: Payer: Self-pay

## 2019-06-07 ENCOUNTER — Emergency Department (HOSPITAL_COMMUNITY): Payer: Medicare Other

## 2019-06-07 ENCOUNTER — Encounter (HOSPITAL_COMMUNITY): Payer: Self-pay | Admitting: Emergency Medicine

## 2019-06-07 DIAGNOSIS — E876 Hypokalemia: Secondary | ICD-10-CM | POA: Diagnosis present

## 2019-06-07 DIAGNOSIS — Z880 Allergy status to penicillin: Secondary | ICD-10-CM

## 2019-06-07 DIAGNOSIS — D6959 Other secondary thrombocytopenia: Secondary | ICD-10-CM | POA: Diagnosis present

## 2019-06-07 DIAGNOSIS — R627 Adult failure to thrive: Secondary | ICD-10-CM | POA: Diagnosis not present

## 2019-06-07 DIAGNOSIS — M810 Age-related osteoporosis without current pathological fracture: Secondary | ICD-10-CM | POA: Diagnosis present

## 2019-06-07 DIAGNOSIS — Z66 Do not resuscitate: Secondary | ICD-10-CM | POA: Diagnosis present

## 2019-06-07 DIAGNOSIS — Z681 Body mass index (BMI) 19 or less, adult: Secondary | ICD-10-CM

## 2019-06-07 DIAGNOSIS — R6510 Systemic inflammatory response syndrome (SIRS) of non-infectious origin without acute organ dysfunction: Secondary | ICD-10-CM | POA: Diagnosis present

## 2019-06-07 DIAGNOSIS — F0280 Dementia in other diseases classified elsewhere without behavioral disturbance: Secondary | ICD-10-CM | POA: Diagnosis present

## 2019-06-07 DIAGNOSIS — G9341 Metabolic encephalopathy: Secondary | ICD-10-CM | POA: Diagnosis present

## 2019-06-07 DIAGNOSIS — E860 Dehydration: Secondary | ICD-10-CM | POA: Diagnosis not present

## 2019-06-07 DIAGNOSIS — Z7401 Bed confinement status: Secondary | ICD-10-CM | POA: Diagnosis not present

## 2019-06-07 DIAGNOSIS — Z881 Allergy status to other antibiotic agents status: Secondary | ICD-10-CM | POA: Diagnosis not present

## 2019-06-07 DIAGNOSIS — G309 Alzheimer's disease, unspecified: Secondary | ICD-10-CM | POA: Diagnosis present

## 2019-06-07 DIAGNOSIS — E870 Hyperosmolality and hypernatremia: Principal | ICD-10-CM | POA: Diagnosis present

## 2019-06-07 DIAGNOSIS — Z79899 Other long term (current) drug therapy: Secondary | ICD-10-CM | POA: Diagnosis not present

## 2019-06-07 DIAGNOSIS — Z20822 Contact with and (suspected) exposure to covid-19: Secondary | ICD-10-CM | POA: Diagnosis present

## 2019-06-07 DIAGNOSIS — R4182 Altered mental status, unspecified: Secondary | ICD-10-CM | POA: Diagnosis not present

## 2019-06-07 DIAGNOSIS — E440 Moderate protein-calorie malnutrition: Secondary | ICD-10-CM | POA: Diagnosis present

## 2019-06-07 DIAGNOSIS — D72829 Elevated white blood cell count, unspecified: Secondary | ICD-10-CM | POA: Diagnosis present

## 2019-06-07 DIAGNOSIS — R509 Fever, unspecified: Secondary | ICD-10-CM | POA: Diagnosis present

## 2019-06-07 DIAGNOSIS — G300 Alzheimer's disease with early onset: Secondary | ICD-10-CM | POA: Diagnosis not present

## 2019-06-07 LAB — COMPREHENSIVE METABOLIC PANEL
ALT: 21 U/L (ref 0–44)
AST: 26 U/L (ref 15–41)
Albumin: 3.3 g/dL — ABNORMAL LOW (ref 3.5–5.0)
Alkaline Phosphatase: 58 U/L (ref 38–126)
Anion gap: 12 (ref 5–15)
BUN: 40 mg/dL — ABNORMAL HIGH (ref 8–23)
CO2: 30 mmol/L (ref 22–32)
Calcium: 9 mg/dL (ref 8.9–10.3)
Chloride: 112 mmol/L — ABNORMAL HIGH (ref 98–111)
Creatinine, Ser: 0.9 mg/dL (ref 0.44–1.00)
GFR calc Af Amer: >60 mL/min (ref >60)
GFR calc non Af Amer: >60 mL/min (ref >60)
Glucose, Bld: 133 mg/dL — ABNORMAL HIGH (ref 70–99)
Potassium: 3.7 mmol/L (ref 3.5–5.1)
Sodium: 154 mmol/L — ABNORMAL HIGH (ref 135–145)
Total Bilirubin: 0.9 mg/dL (ref 0.3–1.2)
Total Protein: 7.1 g/dL (ref 6.5–8.1)

## 2019-06-07 LAB — CBC
HCT: 45.9 % (ref 36.0–46.0)
Hemoglobin: 13.5 g/dL (ref 12.0–15.0)
MCH: 27.8 pg (ref 26.0–34.0)
MCHC: 29.4 g/dL — ABNORMAL LOW (ref 30.0–36.0)
MCV: 94.4 fL (ref 80.0–100.0)
Platelets: 142 10*3/uL — ABNORMAL LOW (ref 150–400)
RBC: 4.86 MIL/uL (ref 3.87–5.11)
RDW: 13.5 % (ref 11.5–15.5)
WBC: 16.6 10*3/uL — ABNORMAL HIGH (ref 4.0–10.5)
nRBC: 0 % (ref 0.0–0.2)

## 2019-06-07 LAB — CBC WITH DIFFERENTIAL/PLATELET
Abs Immature Granulocytes: 0.12 10*3/uL — ABNORMAL HIGH (ref 0.00–0.07)
Basophils Absolute: 0.1 10*3/uL (ref 0.0–0.1)
Basophils Relative: 1 %
Eosinophils Absolute: 0 10*3/uL (ref 0.0–0.5)
Eosinophils Relative: 0 %
HCT: 53.1 % — ABNORMAL HIGH (ref 36.0–46.0)
Hemoglobin: 16.2 g/dL — ABNORMAL HIGH (ref 12.0–15.0)
Immature Granulocytes: 1 %
Lymphocytes Relative: 11 %
Lymphs Abs: 2.1 10*3/uL (ref 0.7–4.0)
MCH: 28.2 pg (ref 26.0–34.0)
MCHC: 30.5 g/dL (ref 30.0–36.0)
MCV: 92.5 fL (ref 80.0–100.0)
Monocytes Absolute: 1.4 10*3/uL — ABNORMAL HIGH (ref 0.1–1.0)
Monocytes Relative: 7 %
Neutro Abs: 15.8 10*3/uL — ABNORMAL HIGH (ref 1.7–7.7)
Neutrophils Relative %: 80 %
Platelets: 187 10*3/uL (ref 150–400)
RBC: 5.74 MIL/uL — ABNORMAL HIGH (ref 3.87–5.11)
RDW: 13.5 % (ref 11.5–15.5)
WBC: 19.5 10*3/uL — ABNORMAL HIGH (ref 4.0–10.5)
nRBC: 0 % (ref 0.0–0.2)

## 2019-06-07 LAB — POC SARS CORONAVIRUS 2 AG -  ED: SARS Coronavirus 2 Ag: NEGATIVE

## 2019-06-07 LAB — INFLUENZA PANEL BY PCR (TYPE A & B)
Influenza A By PCR: NEGATIVE
Influenza B By PCR: NEGATIVE

## 2019-06-07 LAB — CREATININE, SERUM
Creatinine, Ser: 0.66 mg/dL (ref 0.44–1.00)
GFR calc Af Amer: >60 mL/min (ref >60)
GFR calc non Af Amer: >60 mL/min (ref >60)

## 2019-06-07 LAB — LACTIC ACID, PLASMA: Lactic Acid, Venous: 1.7 mmol/L (ref 0.5–1.9)

## 2019-06-07 MED ORDER — SODIUM CHLORIDE 0.9 % IV SOLN: 2.0000 g | Freq: Once | INTRAVENOUS | Status: DC

## 2019-06-07 MED ORDER — METRONIDAZOLE IN NACL 5-0.79 MG/ML-% IV SOLN
500.0000 mg | Freq: Three times a day (TID) | INTRAVENOUS | Status: DC
  Administered 2019-06-08 – 2019-06-09 (×4): 500 mg via INTRAVENOUS
  Filled 2019-06-07 (×4): qty 100

## 2019-06-07 MED ORDER — MORPHINE SULFATE (PF) 2 MG/ML IV SOLN
2.0000 mg | INTRAVENOUS | Status: DC | PRN
  Filled 2019-06-07: qty 1

## 2019-06-07 MED ORDER — SODIUM CHLORIDE 0.9 % IV SOLN
1.0000 g | Freq: Three times a day (TID) | INTRAVENOUS | Status: DC
  Administered 2019-06-07 – 2019-06-12 (×15): 1 g via INTRAVENOUS
  Filled 2019-06-07 (×16): qty 1

## 2019-06-07 MED ORDER — ONDANSETRON HCL 4 MG/2ML IJ SOLN: 4.0000 mg | Freq: Four times a day (QID) | INTRAMUSCULAR | Status: DC | PRN

## 2019-06-07 MED ORDER — VANCOMYCIN HCL 750 MG/150ML IV SOLN
750.0000 mg | Freq: Once | INTRAVENOUS | Status: AC
  Administered 2019-06-07: 750 mg via INTRAVENOUS
  Filled 2019-06-07: qty 150

## 2019-06-07 MED ORDER — VANCOMYCIN HCL IN DEXTROSE 1-5 GM/200ML-% IV SOLN: 1000.0000 mg | Freq: Once | INTRAVENOUS | Status: DC

## 2019-06-07 MED ORDER — ONDANSETRON HCL 4 MG PO TABS: 4.0000 mg | ORAL_TABLET | Freq: Four times a day (QID) | ORAL | Status: DC | PRN

## 2019-06-07 MED ORDER — LORAZEPAM 2 MG/ML IJ SOLN: 1.0000 mg | INTRAMUSCULAR | Status: DC | PRN

## 2019-06-07 MED ORDER — METRONIDAZOLE IN NACL 5-0.79 MG/ML-% IV SOLN
500.0000 mg | Freq: Once | INTRAVENOUS | Status: AC
  Administered 2019-06-07: 500 mg via INTRAVENOUS
  Filled 2019-06-07: qty 100

## 2019-06-07 MED ORDER — SODIUM CHLORIDE 0.9 % IV BOLUS
500.0000 mL | Freq: Once | INTRAVENOUS | Status: AC
  Administered 2019-06-07: 18:00:00 500 mL via INTRAVENOUS

## 2019-06-07 MED ORDER — ENOXAPARIN SODIUM 30 MG/0.3ML ~~LOC~~ SOLN
30.0000 mg | SUBCUTANEOUS | Status: DC
  Administered 2019-06-07 – 2019-06-12 (×6): 30 mg via SUBCUTANEOUS
  Filled 2019-06-07 (×6): qty 0.3

## 2019-06-07 MED ORDER — VANCOMYCIN HCL 500 MG/100ML IV SOLN
500.0000 mg | INTRAVENOUS | Status: DC
  Administered 2019-06-09: 500 mg via INTRAVENOUS
  Filled 2019-06-07: qty 100

## 2019-06-07 MED ORDER — SODIUM CHLORIDE 0.9 % IV SOLN: INTRAVENOUS | Status: DC

## 2019-06-07 NOTE — H&P (Signed)
History and Physical    Sheri Lucas HEN:[id number] DOB: 02/07/1946 DOA: 06/07/2019  PCP: Koren Shiver, DO  Patient coming from: SNF  Chief Complaint: Altered mental status  HPI: Sheri Lucas is a 74 y.o. female with medical history significant of advanced dementia brought in today because her husband went to visit her today and she was very sedated and sleepy not responding as normal.  At her baseline she is nonverbal mostly and nonambulatory bedbound.  They have adjusted her medications recently because she was screaming a lot was unclear whether or not she was in pain or was just scared.  Since they have adjusted her medications she has been sleeping a lot and not eating and drinking very well.  Husband reports no nausea vomiting or diarrhea.  Husband is at baseline.  Husband reports she has been deteriorating for months.  They are in the process of switching to hospice at the nursing home.  Patient is DNR.  Patient was noted to have a fever of 101 and sent to the emergency department.  Chest x-ray is negative.  Urinalysis is pending.  Patient is very dehydrated.  Patient be referred for admission for dehydration.  Review of Systems: Unobtainable secondary to dementia  Past Medical History:  Diagnosis Date  . Alzheimer's disease (HCC) 03/07/2017  . Memory deficits 01/13/2015  . Osteoporosis     Past Surgical History:  Procedure Laterality Date  . NO PAST SURGERIES       reports that she has never smoked. She has never used smokeless tobacco. She reports that she does not drink alcohol or use drugs.  Allergies  Allergen Reactions  . Amoxicillin Other (See Comments)    Unknown reaction Did it involve swelling of the face/tongue/throat, SOB, or low BP? Unknown Did it involve sudden or severe rash/hives, skin peeling, or any reaction on the inside of your mouth or nose? Unknown Did you need to seek medical attention at a hospital or doctor's office? Unknown When did it last  happen?unknown - more than 52 yrs ago per spouse If all above answers are "NO", may proceed with cephalosporin use.  Marland Kitchen Keflex [Cephalexin] Other (See Comments)    Unknown reaction  . Penicillins Other (See Comments)    Unknown reaction (per MAR)Did it involve swelling of the face/tongue/throat, SOB, or low BP? Unknown Did it involve sudden or severe rash/hives, skin peeling, or any reaction on the inside of your mouth or nose? Unknown Did you need to seek medical attention at a hospital or doctor's office? Unknown When did it last happen?unknown - more than 52 yrs ago per spouse If all above answers are "NO", may proceed with cephalosporin use.     Family History  Problem Relation Age of Onset  . Heart attack Father   . Pancreatic cancer Brother     Prior to Admission medications   Medication Sig Start Date End Date Taking? Authorizing Provider  acetaminophen (TYLENOL) 500 MG tablet Take 500 mg by mouth every 12 (twelve) hours.   Yes [provider]  ALPRAZolam (XANAX) 0.25 MG tablet Take 0.25 mg by mouth 3 (three) times daily.   Yes [provider]  divalproex (DEPAKOTE) 250 MG DR tablet Take 250 mg by mouth 2 (two) times daily. For behaviors   Yes [provider]  donepezil (ARICEPT) 10 MG tablet TAKE 1 TABLET BY MOUTH EVERYDAY AT BEDTIME Patient taking differently: Take 10 mg by mouth at bedtime.  10/16/18  Yes Margie Ege  J, NP  Melatonin 5 MG TABS Take 5 mg by mouth at bedtime.   Yes [provider]  memantine (NAMENDA) 10 MG tablet Take 1 tablet (10 mg total) by mouth 2 (two) times daily. 10/16/18  Yes Suzzanne Cloud, NP  sertraline (ZOLOFT) 100 MG tablet Take 100 mg by mouth daily.   Yes [provider]  traZODone (DESYREL) 50 MG tablet Take 25 mg by mouth at bedtime.   Yes [provider]    Physical Exam: Vitals:   06/07/19 1830 06/07/19 1930 06/07/19 2000 06/07/19 2030  BP: 122/71 111/72 128/70 125/73    Pulse: 78 74 79 74  Resp: 14 12 10 11   Temp:      TempSrc:      SpO2: 94% 93% 96% 96%  Weight:      Height:          Constitutional: NAD, calm, comfortable cachectic Vitals:   06/07/19 1830 06/07/19 1930 06/07/19 2000 06/07/19 2030  BP: 122/71 111/72 128/70 125/73  Pulse: 78 74 79 74  Resp: 14 12 10 11   Temp:      TempSrc:      SpO2: 94% 93% 96% 96%  Weight:      Height:       Eyes: PERRL, lids and conjunctivae normal ENMT: Mucous membranes are dry. Posterior pharynx clear of any exudate or lesions.Normal dentition.  Neck: normal, supple, no masses, no thyromegaly Respiratory: clear to auscultation bilaterally, no wheezing, no crackles. Normal respiratory effort. No accessory muscle use.  Cardiovascular: Regular rate and rhythm, no murmurs / rubs / gallops. No extremity edema. 2+ pedal pulses. No carotid bruits.  Abdomen: no tenderness, no masses palpated. No hepatosplenomegaly. Bowel sounds positive.  Musculoskeletal: no clubbing / cyanosis. No joint deformity upper and lower extremities. Good ROM, no contractures. Normal muscle tone.  Skin: no rashes, lesions, ulcers. No induration Neurologic: CN 2-12 grossly intact. Sensation intact, DTR normal. Strength 5/5 in all 4.  Psychiatric: Not normal judgment and insight. Alert and oriented x 0. Normal mood.    Labs on Admission: I have personally reviewed following labs and imaging studies  CBC: Recent Labs  Lab 06/07/19 1710  WBC 19.5*  NEUTROABS 15.8*  HGB 16.2*  HCT 53.1*  MCV 92.5  PLT 382   Basic Metabolic Panel: Recent Labs  Lab 06/07/19 1710  NA 154*  K 3.7  CL 112*  CO2 30  GLUCOSE 133*  BUN 40*  CREATININE 0.90  CALCIUM 9.0   GFR: Estimated Creatinine Clearance: 36.4 mL/min (by C-G formula based on SCr of 0.9 mg/dL). Liver Function Tests: Recent Labs  Lab 06/07/19 1710  AST 26  ALT 21  ALKPHOS 58  BILITOT 0.9  PROT 7.1  ALBUMIN 3.3*   No results for input(s): LIPASE, AMYLASE in the last  168 hours. No results for input(s): AMMONIA in the last 168 hours. Coagulation Profile: No results for input(s): INR, PROTIME in the last 168 hours. Cardiac Enzymes: No results for input(s): CKTOTAL, CKMB, CKMBINDEX, TROPONINI in the last 168 hours. BNP (last 3 results) No results for input(s): PROBNP in the last 8760 hours. HbA1C: No results for input(s): HGBA1C in the last 72 hours. CBG: No results for input(s): GLUCAP in the last 168 hours. Lipid Profile: No results for input(s): CHOL, HDL, LDLCALC, TRIG, CHOLHDL, LDLDIRECT in the last 72 hours. Thyroid Function Tests: No results for input(s): TSH, T4TOTAL, FREET4, T3FREE, THYROIDAB in the last 72 hours. Anemia Panel: No results for input(s):  VITAMINB12, FOLATE, FERRITIN, TIBC, IRON, RETICCTPCT in the last 72 hours. Urine analysis: No results found for: COLORURINE, APPEARANCEUR, LABSPEC, PHURINE, GLUCOSEU, HGBUR, BILIRUBINUR, KETONESUR, PROTEINUR, UROBILINOGEN, NITRITE, LEUKOCYTESUR Sepsis Labs: !!!!!!!!!!!!!!!!!!!!!!!!!!!!!!!!!!!!!!!!!!!! @LABRCNTIP (procalcitonin:4,lacticidven:4) )No results found for this or any previous visit (from the past 240 hour(s)).   Radiological Exams on Admission: DG Chest Port 1 View  Result Date: 06/07/2019 CLINICAL DATA:  Cough, altered mental status EXAM: PORTABLE CHEST 1 VIEW COMPARISON:  None. FINDINGS: No consolidation, features of edema, pneumothorax, or effusion. The cardiomediastinal contours are unremarkable. No acute osseous or soft tissue abnormality. Degenerative changes are present in the imaged spine and shoulders. Telemetry leads overlie the chest. IMPRESSION: No acute cardiopulmonary disease. Electronically Signed   By: 08/07/2019 M.D.   On: 06/07/2019 17:20   Chest x-ray reviewed no edema or infiltrate Old chart reviewed Case discussed with Dr. 08/07/2019 in the ED  Assessment/Plan 74 year old female with advanced dementia comes in with failure to thrive, dehydration and  fever  Principal Problem:   Dehydration-IV fluids.  Patient already has seemed to perk up a little bit she opens her eyes now and can answer no when asked if she is in pain.  Continue IV fluids through the night.  Active Problems:   Fever-follow-up on blood cultures.  Check procalcitonin level.  Check influenza panel.  Covid PCR is pending.  Placed on Vanco cefepime and Flagyl.  Continue antibiotics at least through the night.    Alzheimer's disease (HCC)-noted    FTT (failure to thrive) in adult-noted    Hypernatremia-secondary to dehydration    I had a long discussion with her husband of over 50 years about goals of care at this time.  They are considering switching over to comfort care only but he would like IV fluids and antibiotics at this time.  He is going to speak to his 2 children but they have pretty much decided to use hospice but are unclear about what the goals will be at that point.  They definitely do not want any feeding tube or resuscitation.  She is DNR.  He is considering stopping all care and just total comfort care with just morphine and Ativan at this time.  I have encouraged him to speak to his children about it but there is no rush to make this decision at this time.   DVT prophylaxis: Lovenox Code Status: DNR Family Communication: Husband at bedside Disposition Plan: 1 to 3 days Consults called: None Admission status: Admission   Kei Mcelhiney A MD [facility]  If 7PM-7AM, please contact night-coverage [email or web address] Password [email or web address]  06/07/2019, 9:02 PM

## 2019-06-07 NOTE — ED Notes (Signed)
Admitting at bedside 

## 2019-06-07 NOTE — Progress Notes (Signed)
Pharmacy Antibiotic Note Sheri Lucas is a 74 y.o. female admitted on 06/07/2019 with failure to thrive. Pharmacy has been consulted for vancomycin and aztreonam dosing.  WBC 19.5, lactate 1.7, Tmax 100.5 CXR shows no acute findings Unable to obtain urinalysis today Allergy to PCN and cephalosporins with no recent history of use.   Plan: Vancomycin 750mg  IV x1 then 500mg  IV Q24h Goal trough 15-20 mcg/mL Goal AUC 400-550 Expected AUC: 492 SCr used: 0.9 Aztreonam 1g IV Q8h F/u need to continue metronidazole F/u renal function, clinical progress, cultures, LOT, and vancomycin levels as indicated   Height: 5' (152.4 cm) Weight: 90 lb (40.8 kg) IBW/kg (Calculated) : 45.5  Temp (24hrs), Avg:100 F (37.8 C), Min:99.5 F (37.5 C), Max:100.5 F (38.1 C)  Recent Labs  Lab 06/07/19 1710 06/07/19 1715  WBC 19.5*  --   CREATININE 0.90  --   LATICACIDVEN  --  1.7    Estimated Creatinine Clearance: 36.4 mL/min (by C-G formula based on SCr of 0.9 mg/dL).    Allergies  Allergen Reactions  . Amoxicillin   . Keflex [Cephalexin]     Antimicrobials this admission: 3/5 vancomycin >>  3/5 aztreonam >>  3/5 flagyl x1  Dose adjustments this admission: N/A  Microbiology results: 3/5 BCx: sent 3/5 UCx: needs collected   Thank you for allowing pharmacy to be a part of this patient's care.  08/07/19, PharmD PGY1 Ambulatory Care Pharmacy Resident 06/07/2019 7:37 PM

## 2019-06-07 NOTE — ED Provider Notes (Signed)
[facility] EMERGENCY DEPARTMENT Provider Note   CSN: [id number] Arrival date & time: 06/07/19  1631     History Chief Complaint  Patient presents with  . Failure To Thrive    Sheri Lucas is a 74 y.o. female.  74 year old female with history of Alzheimer's disease who presents due to failure to thrive.  Patient has been medicated with sedatives due to escalating behavior and this is apparently caused her to not take oral fluids.  This is been going on for several days.  Had a fever of 101 that was treated with Tylenol.  No reported cough.  No reported emesis.  EMS called yesterday and according to the husband it was decided to just watch the patient overnight.  Which did not improve she was sent here.  Patient is a DNR        Past Medical History:  Diagnosis Date  . Alzheimer's disease (HCC) 03/07/2017  . Memory deficits 01/13/2015  . Osteoporosis     Patient Active Problem List   Diagnosis Date Noted  . Alzheimer's disease (HCC) 03/07/2017  . Memory deficits 01/13/2015    Past Surgical History:  Procedure Laterality Date  . NO PAST SURGERIES       OB History   No obstetric history on file.     Family History  Problem Relation Age of Onset  . Heart attack Father   . Pancreatic cancer Brother     Social History   Tobacco Use  . Smoking status: Never Smoker  . Smokeless tobacco: Never Used  Substance Use Topics  . Alcohol use: No  . Drug use: No    Home Medications Prior to Admission medications   Medication Sig Start Date End Date Taking? Authorizing Provider  Calcium Carbonate-Vit D-Min (CALCIUM/VITAMIN D/MINERALS) 600-200 MG-UNIT TABS Take 1 tablet by mouth daily.    [provider]  donepezil (ARICEPT) 10 MG tablet TAKE 1 TABLET BY MOUTH EVERYDAY AT BEDTIME 10/16/18   Glean Salvo, NP  memantine (NAMENDA) 10 MG tablet Take 1 tablet (10 mg total) by mouth 2 (two) times daily. 10/16/18   Glean Salvo, NP  Multiple  Vitamin (MULTIVITAMIN) tablet Take 1 tablet by mouth daily.    [provider]  OVER THE COUNTER MEDICATION Take 2 capsules by mouth 2 (two) times daily. Phosphatidyl    [provider]  Prasterone (DEHYDROEPIANDROSTERONE) POWD 1 capsule by Does not apply route daily. 2mg  capsule daily. , MD prescribes this.    [provider]  Progesterone Micronized (PROGESTERONE PO) Take 6.25 mg by mouth daily. Progresterone SR 6.25mg  capsule daily Dennie Bible, MD prescribes this.    [provider]  UNABLE TO FIND Take 1 Dose by mouth daily. Med Name: pregnenolone SR 3.5 mg capsule Dennie Bible, MD prescribes this.    [provider]    Allergies    Amoxicillin and Keflex [cephalexin]  Review of Systems   Review of Systems  All other systems reviewed and are negative.   Physical Exam Updated Vital Signs BP 125/82 (BP Location: Left Arm)   Pulse 89   Temp 99.5 F (37.5 C) (Axillary)   Resp 16   Ht 1.524 m (5')   Wt 40.8 kg   SpO2 94%   BMI 17.58 kg/m   Physical Exam Vitals and nursing note reviewed.  Constitutional:      General: She is not in acute distress.    Appearance: She is cachectic. She is not  toxic-appearing.  HENT:     Head: Normocephalic and atraumatic.  Eyes:     General: Lids are normal.     Conjunctiva/sclera: Conjunctivae normal.     Pupils: Pupils are equal, round, and reactive to light.  Neck:     Thyroid: No thyroid mass.     Trachea: No tracheal deviation.  Cardiovascular:     Rate and Rhythm: Normal rate and regular rhythm.     Heart sounds: Normal heart sounds. No murmur. No gallop.   Pulmonary:     Effort: Pulmonary effort is normal. No respiratory distress.     Breath sounds: Normal breath sounds. No stridor. No decreased breath sounds, wheezing, rhonchi or rales.  Abdominal:     General: Bowel sounds are normal. There is no distension.     Palpations: Abdomen is soft.     Tenderness:  There is no abdominal tenderness. There is no rebound.  Musculoskeletal:        General: No tenderness. Normal range of motion.     Cervical back: Normal range of motion and neck supple.  Skin:    General: Skin is warm and dry.     Findings: No abrasion or rash.  Neurological:     Mental Status: She is lethargic.     GCS: GCS eye subscore is 4. GCS verbal subscore is 3. GCS motor subscore is 5.     Comments: Patient withdraws to pain in all 4 extremities  Psychiatric:        Speech: Speech normal.        Behavior: Behavior normal.     ED Results / Procedures / Treatments   Labs (all labs ordered are listed, but only abnormal results are displayed) Labs Reviewed  CULTURE, BLOOD (ROUTINE X 2)  CULTURE, BLOOD (ROUTINE X 2)  CBC WITH DIFFERENTIAL/PLATELET  COMPREHENSIVE METABOLIC PANEL  URINALYSIS, ROUTINE W REFLEX MICROSCOPIC  LACTIC ACID, PLASMA  POC SARS CORONAVIRUS 2 AG -  ED    EKG None  Radiology No results found.  Procedures Procedures (including critical care time)  Medications Ordered in ED Medications  sodium chloride 0.9 % bolus 500 mL (has no administration in time range)  0.9 %  sodium chloride infusion (has no administration in time range)    ED Course  I have reviewed the triage vital signs and the nursing notes.  Pertinent labs & imaging results that were available during my care of the patient were reviewed by me and considered in my medical decision making (see chart for details).     Patient here with fever and dehydration.  She has hyponatremia with a sodium of 154.  Patient does have significant leukocytosis of 19.5 thousand.  Will start antibiotics.  Given IV fluids here.  Chest x-ray without acute findings.  Attempted to obtain urine without success.  Likely because patient is dehydrated.  Will admit to the hospital service  CRITICAL CARE Performed by: Toy Baker Total critical care time: 45 minutes Critical care time was exclusive of  separately billable procedures and treating other patients. Critical care was necessary to treat or prevent imminent or life-threatening deterioration. Critical care was time spent personally by me on the following activities: development of treatment plan with patient and/or surrogate as well as nursing, discussions with consultants, evaluation of patient's response to treatment, examination of patient, obtaining history from patient or surrogate, ordering and performing treatments and interventions, ordering and review of laboratory studies, ordering and review of radiographic studies, pulse oximetry  and re-evaluation of patient's condition.  MDM Rules/Calculators/A&P                       Final Clinical Impression(s) / ED Diagnoses Final diagnoses:  None    Rx / DC Orders ED Discharge Orders    None       Lacretia Leigh, MD 06/07/19 1927

## 2019-06-07 NOTE — ED Notes (Signed)
Attempted to straight cath patient at this time, unable to at obtain urine sample. Will attempt again shortly.

## 2019-06-07 NOTE — ED Notes (Signed)
Pt's Husband Gerlene Burdock would like to be notified with any updates. #[phone]

## 2019-06-07 NOTE — ED Triage Notes (Signed)
Per ems, pt coming from [facility] facility. Pt's husband went to visit pt when she was only responsive to pain. Pt takes Zoloft, Melatonin, Trazodone, and Aricept with recent medication change last week. Pt took meds this morning, staff reports pt has been responsive to painful stimuli for past 3 days which is decreased from baseline. Facility reports pt had temp of 1045F this morning, gave Tylenol and temp with Ems 98.45F. Hx of left sided gaze and Alzheimers. Noticeable weight loss with decreased food/fluid intake. EMS VSS. BP 126/80, HR 96 NSR, Spo2 93% room air. 100% on 4L. CBG 111. 20g left forearm and 18g R forearm

## 2019-06-07 NOTE — ED Notes (Signed)
ED Provider at bedside. 

## 2019-06-07 NOTE — ED Notes (Signed)
X-ray at bedside

## 2019-06-08 DIAGNOSIS — E870 Hyperosmolality and hypernatremia: Principal | ICD-10-CM

## 2019-06-08 DIAGNOSIS — G9341 Metabolic encephalopathy: Secondary | ICD-10-CM

## 2019-06-08 DIAGNOSIS — R509 Fever, unspecified: Secondary | ICD-10-CM

## 2019-06-08 LAB — BASIC METABOLIC PANEL
Anion gap: 12 (ref 5–15)
BUN: 31 mg/dL — ABNORMAL HIGH (ref 8–23)
CO2: 22 mmol/L (ref 22–32)
Calcium: 7.8 mg/dL — ABNORMAL LOW (ref 8.9–10.3)
Chloride: 121 mmol/L — ABNORMAL HIGH (ref 98–111)
Creatinine, Ser: 0.5 mg/dL (ref 0.44–1.00)
GFR calc Af Amer: >60 mL/min (ref >60)
GFR calc non Af Amer: >60 mL/min (ref >60)
Glucose, Bld: 176 mg/dL — ABNORMAL HIGH (ref 70–99)
Potassium: 3 mmol/L — ABNORMAL LOW (ref 3.5–5.1)
Sodium: 155 mmol/L — ABNORMAL HIGH (ref 135–145)

## 2019-06-08 LAB — CBC
HCT: 41.5 % (ref 36.0–46.0)
Hemoglobin: 12.8 g/dL (ref 12.0–15.0)
MCH: 28.6 pg (ref 26.0–34.0)
MCHC: 30.8 g/dL (ref 30.0–36.0)
MCV: 92.6 fL (ref 80.0–100.0)
Platelets: 98 K/uL — ABNORMAL LOW (ref 150–400)
RBC: 4.48 MIL/uL (ref 3.87–5.11)
RDW: 13.6 % (ref 11.5–15.5)
WBC: 14.1 K/uL — ABNORMAL HIGH (ref 4.0–10.5)
nRBC: 0 % (ref 0.0–0.2)

## 2019-06-08 LAB — URINALYSIS, ROUTINE W REFLEX MICROSCOPIC
Bacteria, UA: NONE SEEN
Bilirubin Urine: NEGATIVE
Glucose, UA: NEGATIVE mg/dL
Hgb urine dipstick: NEGATIVE
Ketones, ur: NEGATIVE mg/dL
Leukocytes,Ua: NEGATIVE
Nitrite: NEGATIVE
Protein, ur: 30 mg/dL — AB
Specific Gravity, Urine: 1.032 — ABNORMAL HIGH (ref 1.005–1.030)
pH: 5 (ref 5.0–8.0)

## 2019-06-08 LAB — SARS CORONAVIRUS 2 (TAT 6-24 HRS): SARS Coronavirus 2: NEGATIVE

## 2019-06-08 MED ORDER — TRAZODONE HCL 50 MG PO TABS
25.0000 mg | ORAL_TABLET | Freq: Every day | ORAL | Status: DC
  Administered 2019-06-08 – 2019-06-11 (×4): 25 mg via ORAL
  Filled 2019-06-08 (×4): qty 1

## 2019-06-08 MED ORDER — MEMANTINE HCL 10 MG PO TABS
10.0000 mg | ORAL_TABLET | Freq: Two times a day (BID) | ORAL | Status: DC
  Administered 2019-06-08 – 2019-06-13 (×10): 10 mg via ORAL
  Filled 2019-06-08 (×12): qty 1

## 2019-06-08 MED ORDER — ALPRAZOLAM 0.25 MG PO TABS
0.2500 mg | ORAL_TABLET | Freq: Three times a day (TID) | ORAL | Status: DC
  Administered 2019-06-08 – 2019-06-11 (×8): 0.25 mg via ORAL
  Filled 2019-06-08 (×10): qty 1

## 2019-06-08 MED ORDER — DONEPEZIL HCL 10 MG PO TABS
10.0000 mg | ORAL_TABLET | Freq: Every day | ORAL | Status: DC
  Administered 2019-06-08 – 2019-06-12 (×5): 10 mg via ORAL
  Filled 2019-06-08 (×5): qty 1

## 2019-06-08 MED ORDER — MELATONIN 3 MG PO TABS
3.0000 mg | ORAL_TABLET | Freq: Every day | ORAL | Status: DC
  Administered 2019-06-08 – 2019-06-11 (×4): 3 mg via ORAL
  Filled 2019-06-08 (×6): qty 1

## 2019-06-08 MED ORDER — DIVALPROEX SODIUM 250 MG PO DR TAB
250.0000 mg | DELAYED_RELEASE_TABLET | Freq: Two times a day (BID) | ORAL | Status: DC
  Administered 2019-06-08 – 2019-06-11 (×8): 250 mg via ORAL
  Filled 2019-06-08 (×9): qty 1

## 2019-06-08 MED ORDER — SERTRALINE HCL 100 MG PO TABS
100.0000 mg | ORAL_TABLET | Freq: Every day | ORAL | Status: DC
  Administered 2019-06-08 – 2019-06-11 (×4): 100 mg via ORAL
  Filled 2019-06-08 (×4): qty 1

## 2019-06-08 MED ORDER — DEXTROSE 5 % IV SOLN: INTRAVENOUS | Status: AC

## 2019-06-08 MED ORDER — POTASSIUM CHLORIDE 10 MEQ/100ML IV SOLN
10.0000 meq | INTRAVENOUS | Status: AC
  Administered 2019-06-08 (×2): 10 meq via INTRAVENOUS
  Filled 2019-06-08 (×2): qty 100

## 2019-06-08 NOTE — Evaluation (Signed)
Physical Therapy Evaluation and Discharge Patient Details Name: Sheri Lucas MRN: [id number] DOB: Nov 10, 1945 Today's Date: 06/08/2019   History of Present Illness  74 y.o. female with history of advanced dementia from assisted living facility on 06/07/19 for evaluation of worsening confusion and fever. Being treated for dehydration and broad-spectrum antibiotics as no yet identified source of infection. Per MD notes, "At her baseline she is nonverbal mostly and nonambulatory bedbound" and "They are in the process of switching to hospice at the nursing home." RN reports pt has indicated pain with some movements of her limbs with all xrays negative. RN felt PT ordered to address potential source of pain and recommendations for positioning.   Clinical Impression   Evaluation completed per MD order and RN's request. At this time, pt exhibited no pain responses with PROM x 4 extremities and cervical spine. No areas of skin breakdown noted that could explain the pain with mobilizing patient at bed level. Patient opened eyes for several minutes, but made no attempts to speak. Followed no commands. Nursing appears to be doing an excellent job of maintaining her joint ROM and skin integrity. Recommend continue to reposition at least every 2 hours and cushion bony prominences to avoid pressure ulcers. No further PT indicated.     Follow Up Recommendations No PT follow up;Supervision/Assistance - 24 hour;Other (comment)(return to prior setting if they can continue care vs hospice)    Equipment Recommendations  None recommended by PT    Recommendations for Other Services       Precautions / Restrictions Precautions Precautions: Fall      Mobility  Bed Mobility Overal bed mobility: Needs Assistance Bed Mobility: Rolling Rolling: Total assist            Transfers                    Ambulation/Gait                Stairs            Wheelchair Mobility    Modified  Rankin (Stroke Patients Only)       Balance                                             Pertinent Vitals/Pain Pain Assessment: Faces Faces Pain Scale: No hurt(no pain response elicited with PROM x 4 extremities)    Home Living Family/patient expects to be discharged to:: Other (Comment)(?from ALF and transitioning to Hospice per H&P)                      Prior Function Level of Independence: Needs assistance   Gait / Transfers Assistance Needed: dependent with all mobility           Hand Dominance        Extremity/Trunk Assessment   Upper Extremity Assessment Upper Extremity Assessment: (no AROM noted; full PROM bil hands, wrists, elbows)    Lower Extremity Assessment Lower Extremity Assessment: (PROM WFL including hip extension to 0)    Cervical / Trunk Assessment Cervical / Trunk Assessment: Other exceptions Cervical / Trunk Exceptions: decreased cervical rotation to her left (had been in rt sidelying on arrival);   Communication   Communication: Other (comment)(nonverbal per chart; no attempts to speak although opened ey)  Cognition Arousal/Alertness: Lethargic(did open eyes for ~5 minutes) Behavior During  Therapy: Flat affect Overall Cognitive Status: History of cognitive impairments - at baseline(severe impairments per H&P)                                 General Comments: followed no commands      General Comments General comments (skin integrity, edema, etc.): No areas of redness due to excessive pressure noted (elbows, heels, hips specifically assessed).     Exercises     Assessment/Plan    PT Assessment Patent does not need any further PT services  PT Problem List         PT Treatment Interventions      PT Goals (Current goals can be found in the Care Plan section)  Acute Rehab PT Goals Patient Stated Goal: unable to state PT Goal Formulation: All assessment and education complete, DC therapy     Frequency     Barriers to discharge        Co-evaluation               AM-PAC PT "6 Clicks" Mobility  Outcome Measure Help needed turning from your back to your side while in a flat bed without using bedrails?: Total Help needed moving from lying on your back to sitting on the side of a flat bed without using bedrails?: Total Help needed moving to and from a bed to a chair (including a wheelchair)?: Total Help needed standing up from a chair using your arms (e.g., wheelchair or bedside chair)?: Total Help needed to walk in hospital room?: Total Help needed climbing 3-5 steps with a railing? : Total 6 Click Score: 6    End of Session   Activity Tolerance: No increased pain Patient left: in bed;with call bell/phone within reach;Other (comment)(soft music playing via computer)   PT Visit Diagnosis: Other symptoms and signs involving the nervous system (M84.132)    Time: 4401-0272 PT Time Calculation (min) (ACUTE ONLY): 21 min   Charges:   PT Evaluation $PT Eval Low Complexity: 1 Low           Arby Barrette, PT Pager [phone]   Rexanne Mano 06/08/2019, 5:31 PM

## 2019-06-08 NOTE — Plan of Care (Signed)
  Problem: Health Behavior/Discharge Planning: Goal: Ability to manage health-related needs will improve Outcome: Progressing   Problem: Clinical Measurements: Goal: Ability to maintain clinical measurements within normal limits will improve Outcome: Progressing   Problem: Nutrition: Goal: Adequate nutrition will be maintained Outcome: Progressing   

## 2019-06-08 NOTE — Progress Notes (Addendum)
PROGRESS NOTE        PATIENT DETAILS Name: Sheri Lucas Age: 74 y.o. Sex: female Date of Birth: [birth year] Admit Date: 06/07/2019 Admitting Physician Haydee Monica, MD PVV:[id number], Wille Celeste, DO  Brief Narrative: Patient is a 74 y.o. female with history of advanced dementia brought in from assisted living facility for evaluation of worsening confusion and fever.  See below for further details.  Antimicrobial therapy: Vancomycin: 3/5>> Aztreonam: 3/5>> Flagyl: 3/5>>  Microbiology data 3/5: Blood cultures-negative so far  Subjective: Confused-but pleasant-follows simple commands inconsistently.  Assessment/Plan: Systemic inflammatory response syndrome: Afebrile this morning.  No foci of infection apparent-UA/chest x-ray/COVID-19 negative.  Physical exam without any obvious foci.  Blood cultures negative so far.  Spoke with patient's spouse-continue with broad-spectrum antimicrobial therapy for another day-if cultures are still unrevealing-then I will touch base with this patient spouse on 3/7 to see if family wants imaging to identify foci of infection.  Hypernatremia: Secondary to dehydration-change IV fluid to D5W.  Encourage oral intake.  Hypokalemia: Replete and recheck.  Acute metabolic encephalopathy superimposed on advanced dementia: Encephalopathy likely secondary to hyponatremia and fever/infection.  Supportive care for now-seems to be slowly improving-pleasantly confused this morning.  Thrombocytopenia: Likely secondary to infection/SIRS.  Follow closely-on Lovenox at prophylactic dose.  Dementia: Appears to be advanced-resume Aricept/Namenda/Depakote-Zoloft and trazodone  Goals of care: DNR in place.  Spoke with spouse over the phone-plan is to continue with gentle medical treatment-in the hopes of improvement-and discharge back to prior level of functioning.  If patient deteriorates any further-family does not want aggressive care including  ICU transfer-or any sort of invasive procedures including central lines.  If patient were to deteriorate-apart from transitioning to comfort care-family aware that there really is no further role in escalation of care.   Diet: Diet Order            Diet full liquid Room service appropriate? No; Fluid consistency: Thin  Diet effective now               DVT Prophylaxis: Prophylactic Lovenox   Code Status:  DNR  Family Communication: Spouse over the phone on 3/6  Disposition Plan: Back to ALF/memory care if clinically improved.  If deteriorates-and then may require hospice care.  Barriers to Discharge: Acute encephalopathy-hypernatremia requiring IV fluids-fever/SIRS requiring IV antimicrobial therapy  Antimicrobial agents: Anti-infectives (From admission, onward)   Start     Dose/Rate Route Frequency Ordered Stop   06/08/19 2100  vancomycin (VANCOREADY) IVPB 500 mg/100 mL     500 mg 100 mL/hr over 60 Minutes Intravenous Every 24 hours 06/07/19 1946     06/08/19 0500  metroNIDAZOLE (FLAGYL) IVPB 500 mg     500 mg 100 mL/hr over 60 Minutes Intravenous Every 8 hours 06/07/19 2100     06/07/19 2000  vancomycin (VANCOREADY) IVPB 750 mg/150 mL     750 mg 150 mL/hr over 60 Minutes Intravenous  Once 06/07/19 1944 06/07/19 2308   06/07/19 2000  aztreonam (AZACTAM) 1 g in sodium chloride 0.9 % 100 mL IVPB     1 g 200 mL/hr over 30 Minutes Intravenous Every 8 hours 06/07/19 1944     06/07/19 1930  aztreonam (AZACTAM) 2 g in sodium chloride 0.9 % 100 mL IVPB  Status:  Discontinued     2 g 200 mL/hr over 30 Minutes Intravenous  Once 06/07/19 1928 06/07/19 1943   06/07/19 1930  metroNIDAZOLE (FLAGYL) IVPB 500 mg     500 mg 100 mL/hr over 60 Minutes Intravenous  Once 06/07/19 1928 06/07/19 2155   06/07/19 1930  vancomycin (VANCOCIN) IVPB 1000 mg/200 mL premix  Status:  Discontinued     1,000 mg 200 mL/hr over 60 Minutes Intravenous  Once 06/07/19 1928 06/07/19 1943       Procedures: None  CONSULTS:  None  Time spent: 35 minutes-Greater than 50% of this time was spent in counseling, explanation of diagnosis, planning of further management, and coordination of care.  MEDICATIONS: Scheduled Meds: . enoxaparin (LOVENOX) injection  30 mg Subcutaneous Q24H   Continuous Infusions: . aztreonam 1 g (06/08/19 6789)  . dextrose    . metronidazole Stopped (06/08/19 0558)  . vancomycin     PRN Meds:.LORazepam, morphine injection, ondansetron **OR** ondansetron (ZOFRAN) IV   PHYSICAL EXAM: Vital signs: Vitals:   06/08/19 0100 06/08/19 0151 06/08/19 0243 06/08/19 0929  BP: 113/63  (!) 104/54 115/61  Pulse: 61  65 70  Resp: 11  14 18   Temp:  98.9 F (37.2 C) (!) 97.5 F (36.4 C) 97.7 F (36.5 C)  TempSrc:  Axillary Oral Oral  SpO2: 98%  100% 95%  Weight:   38.6 kg   Height:       Filed Weights   06/07/19 1655 06/08/19 0243  Weight: 40.8 kg 38.6 kg   Body mass index is 16.62 kg/m.   Gen Exam: Pleasantly confused-not in any distress. HEENT:atraumatic, normocephalic Chest: B/L clear to auscultation anteriorly CVS:S1S2 regular Abdomen:soft non tender, non distended Extremities:no edema Neurology: Seems to be moving all 4 extremities-difficult exam. Skin: no rash  I have personally reviewed following labs and imaging studies  LABORATORY DATA:  CBC: Recent Labs  Lab 06/07/19 1710 06/07/19 2218 06/08/19 0554  WBC 19.5* 16.6* 14.1*  NEUTROABS 15.8*  --   --   HGB 16.2* 13.5 12.8  HCT 53.1* 45.9 41.5  MCV 92.5 94.4 92.6  PLT 187 142* 98*    Basic Metabolic Panel: Recent Labs  Lab 06/07/19 1710 06/07/19 2218 06/08/19 0554  NA 154*  --  155*  K 3.7  --  3.0*  CL 112*  --  121*  CO2 30  --  22  GLUCOSE 133*  --  176*  BUN 40*  --  31*  CREATININE 0.90 0.66 0.50  CALCIUM 9.0  --  7.8*    GFR: Estimated Creatinine Clearance: 38.7 mL/min (by C-G formula based on SCr of 0.5 mg/dL).  Liver Function Tests: Recent Labs   Lab 06/07/19 1710  AST 26  ALT 21  ALKPHOS 58  BILITOT 0.9  PROT 7.1  ALBUMIN 3.3*   No results for input(s): LIPASE, AMYLASE in the last 168 hours. No results for input(s): AMMONIA in the last 168 hours.  Coagulation Profile: No results for input(s): INR, PROTIME in the last 168 hours.  Cardiac Enzymes: No results for input(s): CKTOTAL, CKMB, CKMBINDEX, TROPONINI in the last 168 hours.  BNP (last 3 results) No results for input(s): PROBNP in the last 8760 hours.  Lipid Profile: No results for input(s): CHOL, HDL, LDLCALC, TRIG, CHOLHDL, LDLDIRECT in the last 72 hours.  Thyroid Function Tests: No results for input(s): TSH, T4TOTAL, FREET4, T3FREE, THYROIDAB in the last 72 hours.  Anemia Panel: No results for input(s): VITAMINB12, FOLATE, FERRITIN, TIBC, IRON, RETICCTPCT in the last 72 hours.  Urine analysis:    Component Value Date/Time  COLORURINE YELLOW 06/08/2019 0029   APPEARANCEUR CLEAR 06/08/2019 0029   LABSPEC 1.032 (H) 06/08/2019 0029   PHURINE 5.0 06/08/2019 0029   GLUCOSEU NEGATIVE 06/08/2019 0029   HGBUR NEGATIVE 06/08/2019 0029   BILIRUBINUR NEGATIVE 06/08/2019 0029   KETONESUR NEGATIVE 06/08/2019 0029   PROTEINUR 30 (A) 06/08/2019 0029   NITRITE NEGATIVE 06/08/2019 0029   LEUKOCYTESUR NEGATIVE 06/08/2019 0029    Sepsis Labs: Lactic Acid, Venous    Component Value Date/Time   LATICACIDVEN 1.7 06/07/2019 1715    MICROBIOLOGY: Recent Results (from the past 240 hour(s))  Culture, blood (Routine X 2) w Reflex to ID Panel     Status: None (Preliminary result)   Collection Time: 06/07/19  5:00 PM   Specimen: BLOOD RIGHT ARM  Result Value Ref Range Status   Specimen Description BLOOD RIGHT ARM  Final   Special Requests   Final    BOTTLES DRAWN AEROBIC AND ANAEROBIC Blood Culture adequate volume   Culture   Final    NO GROWTH < 24 HOURS Performed at [facility], [address], [place], [place] [address]    Report Status PENDING   Incomplete  Culture, blood (Routine X 2) w Reflex to ID Panel     Status: None (Preliminary result)   Collection Time: 06/07/19  5:05 PM   Specimen: BLOOD RIGHT ARM  Result Value Ref Range Status   Specimen Description BLOOD RIGHT ARM  Final   Special Requests   Final    BOTTLES DRAWN AEROBIC AND ANAEROBIC Blood Culture adequate volume   Culture   Final    NO GROWTH < 24 HOURS Performed at [facility], [address], [place], [place] [address]    Report Status PENDING  Incomplete  SARS CORONAVIRUS 2 (TAT 6-24 HRS) Nasopharyngeal Nasopharyngeal Swab     Status: None   Collection Time: 06/07/19  8:39 PM   Specimen: Nasopharyngeal Swab  Result Value Ref Range Status   SARS Coronavirus 2 NEGATIVE NEGATIVE Final    Comment: (NOTE) SARS-CoV-2 target nucleic acids are NOT DETECTED. The SARS-CoV-2 RNA is generally detectable in upper and lower respiratory specimens during the acute phase of infection. Negative results do not preclude SARS-CoV-2 infection, do not rule out co-infections with other pathogens, and should not be used as the sole basis for treatment or other patient management decisions. Negative results must be combined with clinical observations, patient history, and epidemiological information. The expected result is Negative. Fact Sheet for Patients: HairSlick.no Fact Sheet for Healthcare Providers: quierodirigir.com This test is not yet approved or cleared by the [place] FDA and  has been authorized for detection and/or diagnosis of SARS-CoV-2 by FDA under an Emergency Use Authorization (EUA). This EUA will remain  in effect (meaning this test can be used) for the duration of the COVID-19 declaration under Section 56 4(b)(1) of the Act, 21 U.S.C. section 360bbb-3(b)(1), unless the authorization is terminated or revoked sooner. Performed at [facility], [address], [place], [place] [address]      RADIOLOGY STUDIES/RESULTS: DG Chest Port 1 View  Result Date: 06/07/2019 CLINICAL DATA:  Cough, altered mental status EXAM: PORTABLE CHEST 1 VIEW COMPARISON:  None. FINDINGS: No consolidation, features of edema, pneumothorax, or effusion. The cardiomediastinal contours are unremarkable. No acute osseous or soft tissue abnormality. Degenerative changes are present in the imaged spine and shoulders. Telemetry leads overlie the chest. IMPRESSION: No acute cardiopulmonary disease. Electronically Signed   By: Coralie Keens.D.  On: 06/07/2019 17:20     LOS: 1 day   Jeoffrey Massed, MD  [facility]    To contact the attending provider between 7A-7P or the covering provider during after hours 7P-7A, please log into the web site [email or web address] and access using universal [place] password for that web site. If you do not have the password, please call the hospital operator.  06/08/2019, 11:50 AM

## 2019-06-08 NOTE — ED Notes (Signed)
Husband Richard updated on pt status & dispo

## 2019-06-09 LAB — CBC
HCT: 37.2 % (ref 36.0–46.0)
Hemoglobin: 11.4 g/dL — ABNORMAL LOW (ref 12.0–15.0)
MCH: 28.3 pg (ref 26.0–34.0)
MCHC: 30.6 g/dL (ref 30.0–36.0)
MCV: 92.3 fL (ref 80.0–100.0)
Platelets: 116 10*3/uL — ABNORMAL LOW (ref 150–400)
RBC: 4.03 MIL/uL (ref 3.87–5.11)
RDW: 13.2 % (ref 11.5–15.5)
WBC: 14.1 10*3/uL — ABNORMAL HIGH (ref 4.0–10.5)
nRBC: 0 % (ref 0.0–0.2)

## 2019-06-09 LAB — COMPREHENSIVE METABOLIC PANEL
ALT: 14 U/L (ref 0–44)
AST: 17 U/L (ref 15–41)
Albumin: 2 g/dL — ABNORMAL LOW (ref 3.5–5.0)
Alkaline Phosphatase: 41 U/L (ref 38–126)
Anion gap: 8 (ref 5–15)
BUN: 18 mg/dL (ref 8–23)
CO2: 28 mmol/L (ref 22–32)
Calcium: 7.7 mg/dL — ABNORMAL LOW (ref 8.9–10.3)
Chloride: 114 mmol/L — ABNORMAL HIGH (ref 98–111)
Creatinine, Ser: 0.64 mg/dL (ref 0.44–1.00)
GFR calc Af Amer: >60 mL/min (ref >60)
GFR calc non Af Amer: >60 mL/min (ref >60)
Glucose, Bld: 95 mg/dL (ref 70–99)
Potassium: 2.9 mmol/L — ABNORMAL LOW (ref 3.5–5.1)
Sodium: 150 mmol/L — ABNORMAL HIGH (ref 135–145)
Total Bilirubin: 0.3 mg/dL (ref 0.3–1.2)
Total Protein: 4.5 g/dL — ABNORMAL LOW (ref 6.5–8.1)

## 2019-06-09 LAB — MAGNESIUM: Magnesium: 2.1 mg/dL (ref 1.7–2.4)

## 2019-06-09 MED ORDER — POTASSIUM CHLORIDE CRYS ER 20 MEQ PO TBCR
40.0000 meq | EXTENDED_RELEASE_TABLET | Freq: Once | ORAL | Status: AC
  Administered 2019-06-09: 40 meq via ORAL
  Filled 2019-06-09: qty 2

## 2019-06-09 MED ORDER — POTASSIUM CHLORIDE 10 MEQ/100ML IV SOLN
10.0000 meq | INTRAVENOUS | Status: AC
  Administered 2019-06-09 (×2): 10 meq via INTRAVENOUS
  Filled 2019-06-09 (×2): qty 100

## 2019-06-09 NOTE — Evaluation (Signed)
Occupational Therapy Evaluation Patient Details Name: Sheri Lucas MRN: [id number] DOB: [birth year] Today's Date: 06/09/2019    History of Present Illness 74 y.o. female with history of advanced dementia from assisted living facility on 06/07/19 for evaluation of worsening confusion and fever. Being treated for dehydration and broad-spectrum antibiotics as no yet identified source of infection.    Clinical Impression   Patient evaluated by Occupational Therapy with no further acute OT needs identified. All education has been completed and the patient has no further questions. Spoke with spouse.  Pt has been residing in ALF for the past several months and has experienced a progressive decline in function.  He reports she is total A for all aspects of ADLs, and requires +2 assist to transfer to and from w/c.  He reports she has been more somnolent for the 3-4 days PTA.  He reports he is hoping to transition her to comfort care/Hospice services.  Encouraged him to perform PROM of UEs to prevent tightness and for comfort.  She is unable to engage in any meaningful/purposeful activity this date.  See below for any follow-up Occupational Therapy or equipment needs. OT is signing off. Thank you for this referral.      Follow Up Recommendations  No OT follow up;SNF(ALF with 24 hour care if they are able to provide this level)    Equipment Recommendations  None recommended by OT    Recommendations for Other Services Other (comment)(palliative care )     Precautions / Restrictions Precautions Precautions: Fall Restrictions Weight Bearing Restrictions: No      Mobility Bed Mobility               General bed mobility comments: requires total A for all aspects   Transfers                 General transfer comment: did not attempt     Balance                                           ADL either performed or assessed with clinical judgement   ADL Overall  ADL's : Needs assistance/impaired                                       General ADL Comments: Pt requires total A for all aspects of ADLs.  and is unable to assist with any aspect      Vision         Perception     Praxis      Pertinent Vitals/Pain Pain Assessment: Faces Faces Pain Scale: No hurt     Hand Dominance     Extremity/Trunk Assessment Upper Extremity Assessment Upper Extremity Assessment: LUE deficits/detail;RUE deficits/detail RUE Deficits / Details: PROM grossly WFL  RUE Coordination: decreased fine motor;decreased gross motor LUE Deficits / Details: Pt with minimal active movement noted Elbow distally  LUE Coordination: decreased fine motor;decreased gross motor           Communication Communication Communication: Expressive difficulties;Receptive difficulties(Pt will occasionally mumble uninteligible words )   Cognition Arousal/Alertness: Lethargic Behavior During Therapy: Flat affect Overall Cognitive Status: Impaired/Different from baseline  General Comments: Pt with h/o progressive dementia/Alzheimer's, and per spouse has had a slow, but steady delince over the course of the past 7 years, or so.  He reports she has demonstrated a rapid decline in function over the past 7-9 months since being placed in ALF.   He reports she didn't appear to recognize him, and was having behavioral issues/agitation at ALF.  He reports she is more lethargic and less responsive than she was previously     General Comments  Spouse reports they were in process of transitioning to Hospice at ALF, but this had not been fully set up yet     Exercises Exercises: Other exercises Other Exercises Other Exercises: Instructed spouse how to operate [facility] to raise HOB.  He was asking about giving her liquids.  Instructed him to only attempt liquids when she is fully alert/awake and only when [facility] elevated fully - instructed  him how to adjust bed    Shoulder Instructions      Home Living Family/patient expects to be discharged to:: Other (Comment)                                 Additional Comments: [facility] [place]       Prior Functioning/Environment Level of Independence: Needs assistance  Gait / Transfers Assistance Needed: Pt required +2 assist to transfer to and from w/c at ALF, per spouse.  Pt was getting to w/c consistently until ~3-4 days PTA  ADL's / Homemaking Assistance Needed: Pt requires total A for all aspects of ADLs per her spouse.  She no longer was able to assist with any aspects of self feeding             OT Problem List: Decreased strength;Decreased activity tolerance;Decreased cognition      OT Treatment/Interventions:      OT Goals(Current goals can be found in the care plan section) Acute Rehab OT Goals Patient Stated Goal: spouse wants her to be comfortable  OT Goal Formulation: All assessment and education complete, DC therapy Potential to Achieve Goals: Poor  OT Frequency:     Barriers to D/C:            Co-evaluation              AM-PAC OT "6 Clicks" Daily Activity     Outcome Measure Help from another person eating meals?: Total Help from another person taking care of personal grooming?: Total Help from another person toileting, which includes using toliet, bedpan, or urinal?: Total Help from another person bathing (including washing, rinsing, drying)?: Total Help from another person to put on and taking off regular upper body clothing?: Total Help from another person to put on and taking off regular lower body clothing?: Total 6 Click Score: 6   End of Session    Activity Tolerance: Patient limited by lethargy Patient left: in bed;with call bell/phone within reach;with bed alarm set  OT Visit Diagnosis: Cognitive communication deficit (G83.662)                Time: 9476-5465 OT Time Calculation (min): 20  min Charges:  OT General Charges $OT Visit: 1 Visit OT Evaluation $OT Eval Low Complexity: 1 Low  Eber Jones., OTR/L Acute Rehabilitation Services Pager [phone] Office [phone]   Jeani Hawking M 06/09/2019, 11:03 AM

## 2019-06-09 NOTE — Evaluation (Signed)
Clinical/Bedside Swallow Evaluation Patient Details  Name: Sheri Lucas MRN: [id number] Date of Birth: 26-Feb-1946  Today's Date: 06/09/2019 Time: SLP Start Time (ACUTE ONLY): 1255 SLP Stop Time (ACUTE ONLY): 1308 SLP Time Calculation (min) (ACUTE ONLY): 13 min  Past Medical History:  Past Medical History:  Diagnosis Date  . Alzheimer's disease ([place]) 03/07/2017  . Memory deficits 01/13/2015  . Osteoporosis    Past Surgical History:  Past Surgical History:  Procedure Laterality Date  . NO PAST SURGERIES     HPI:  Pt is a 74 y.o. female with medical history significant of advanced dementia who was admitted because her husband went to visit her and she was very sedated and sleepy not responding as normal. At her baseline she is nonverbal mostly and nonambulatory bedbound.  Chest x-ray was negative for acute changes.  WBC elevated at time of eval; Pt afebrile.   Assessment / Plan / Recommendation Clinical Impression  Pt was seen for bedside swallow evaluation. She was noted to be nonverbal in EMR. Verbal output was limited but she was responded to simple yes/no questions with questionable reliability. A complete oral motor evaluation could not be completed due to pt's difficulty following commands and she was unable to provide any meaningful history regarding her swallowing. She tolerated all solids and consecutive swallows of thin liquids via straw without symptoms of pharyngeal dysphagia including signs of aspiration. However she exhibited symptoms of oral phase dysphagia characterized by oral holding for up to 30 seconds with puree and 14 seconds with liquids. More advanced solids were deferred due to pt's refusal. Her diet may be advanced to puree solids with thin liquids and SLP will follow to ensure diet tolerance.  SLP Visit Diagnosis: Dysphagia, oral phase (R13.11)    Aspiration Risk  Mild aspiration risk    Diet Recommendation Dysphagia 1 (Puree);Thin liquid   Liquid  Administration via: Straw;Cup Medication Administration: Crushed with puree Supervision: Patient able to self feed Compensations: Small sips/bites;Slow rate;Follow solids with liquid Postural Changes: Seated upright at 90 degrees    Other  Recommendations Oral Care Recommendations: Oral care BID;Staff/trained caregiver to provide oral care   Follow up Recommendations None      Frequency and Duration min 2x/week  1 week       Prognosis Prognosis for Safe Diet Advancement: Fair Barriers to Reach Goals: Cognitive deficits      Swallow Study   General Date of Onset: 06/08/19 HPI: Pt is a 74 y.o. female with medical history significant of advanced dementia who was admitted because her husband went to visit her and she was very sedated and sleepy not responding as normal. At her baseline she is nonverbal mostly and nonambulatory bedbound.  Chest x-ray was negative for acute changes.  Type of Study: Bedside Swallow Evaluation Previous Swallow Assessment: [place] Diet Prior to this Study: Thin liquids;Other (Comment)(full liquids) Temperature Spikes Noted: No Respiratory Status: Room air History of Recent Intubation: No Behavior/Cognition: Alert;Cooperative;Confused Oral Cavity Assessment: Within Functional Limits Oral Care Completed by SLP: No Oral Cavity - Dentition: Adequate natural dentition Self-Feeding Abilities: Total assist Patient Positioning: Upright in bed;Postural control adequate for testing Baseline Vocal Quality: Normal Volitional Cough: Cognitively unable to elicit Volitional Swallow: Unable to elicit    Oral/Motor/Sensory Function Overall Oral Motor/Sensory Function: Other (comment)(Unable to assess)   Ice Chips Ice chips: Not tested   Thin Liquid Thin Liquid: Impaired Presentation: Straw Oral Phase Functional Implications: Oral holding Pharyngeal  Phase Impairments: (None)    Nectar Thick  Nectar Thick Liquid: Not tested   Honey Thick Honey Thick Liquid: Not  tested   Puree Puree: Impaired Presentation: Spoon Oral Phase Impairments: Poor awareness of bolus Oral Phase Functional Implications: Oral holding   Solid     Solid: Not tested     Sheri Lucas I. Sheri Clock, MS, CCC-SLP Acute Rehabilitation Services Office number [phone] Pager [phone]  Sheri Lucas 06/09/2019,2:10 PM

## 2019-06-09 NOTE — Plan of Care (Signed)
  Problem: Health Behavior/Discharge Planning: Goal: Ability to manage health-related needs will improve Outcome: Not Progressing   Problem: Nutrition: Goal: Adequate nutrition will be maintained Outcome: Not Progressing   

## 2019-06-09 NOTE — Progress Notes (Signed)
PROGRESS NOTE        PATIENT DETAILS Name: Sheri Lucas Age: 74 y.o. Sex: female Date of Birth: 01/24/1946 Admit Date: 06/07/2019 Admitting Physician Phillips Grout, MD BTD:[id number], Adele Barthel, DO  Brief Narrative: Patient is a 74 y.o. female with history of advanced dementia brought in from assisted living facility for evaluation of worsening confusion and fever.  See below for further details.  Antimicrobial therapy: Vancomycin: 3/5>>3/7 Aztreonam: 3/5>> Flagyl: 3/5>>3/7  Microbiology data 3/5>>Blood cultures-negative so far 3/5>> influenza PCR negative 3/5>> Covid negative.  Subjective: Lying comfortably in bed-no major events overnight per RN at bedside.  Per RN-poor oral intake continues-only consuming a few bites.  Assessment/Plan: Systemic inflammatory response syndrome: Fever curve is improved-afebrile this morning-still has some mild leukocytosis.  No foci of infection apparent clinically.  UA/chest x-ray/COVID-19/influenza/blood cultures all negative.  Since improved-cultures negative-we will: Stop vancomycin and Flagyl today-continue with aztreonam.  Hypernatremia: Secondary to dehydration-improving-continue D5W.  Hypokalemia: Replete and recheck.  Acute metabolic encephalopathy superimposed on advanced dementia: Encephalopathy likely secondary to hyponatremia and fever/infection.  Sleepy this morning-but still encephalopathic and not at baseline.  Thrombocytopenia: Likely secondary to infection/SIRS.  Improving-follow closely-on Lovenox at prophylactic dose.  Dementia: Appears to be advanced-resume Aricept/Namenda/Depakote-Zoloft and trazodone  Goals of care: DNR in place.  Spoke with spouse over the phone ON 3/6-plan is to continue with gentle medical treatment-in the hopes of improvement-and discharge back to prior level of functioning.  If patient deteriorates any further-family does not want aggressive care including ICU transfer-or any  sort of invasive procedures including central lines.  If patient were to deteriorate-apart from transitioning to comfort care-family aware that there really is no further role in escalation of care.   Diet: Diet Order            Diet full liquid Room service appropriate? No; Fluid consistency: Thin  Diet effective now               DVT Prophylaxis: Prophylactic Lovenox   Code Status:  DNR  Family Communication: Spouse over the phone on 3/7  Disposition Plan: Back to ALF/memory care if clinically improved.  If deteriorates-and then may require hospice care.  Barriers to Discharge: Acute encephalopathy-hypernatremia requiring IV fluids-fever/SIRS requiring IV antimicrobial therapy  Antimicrobial agents: Anti-infectives (From admission, onward)   Start     Dose/Rate Route Frequency Ordered Stop   06/08/19 2100  vancomycin (VANCOREADY) IVPB 500 mg/100 mL     500 mg 100 mL/hr over 60 Minutes Intravenous Every 24 hours 06/07/19 1946     06/08/19 0500  metroNIDAZOLE (FLAGYL) IVPB 500 mg     500 mg 100 mL/hr over 60 Minutes Intravenous Every 8 hours 06/07/19 2100     06/07/19 2000  vancomycin (VANCOREADY) IVPB 750 mg/150 mL     750 mg 150 mL/hr over 60 Minutes Intravenous  Once 06/07/19 1944 06/07/19 2308   06/07/19 2000  aztreonam (AZACTAM) 1 g in sodium chloride 0.9 % 100 mL IVPB     1 g 200 mL/hr over 30 Minutes Intravenous Every 8 hours 06/07/19 1944     06/07/19 1930  aztreonam (AZACTAM) 2 g in sodium chloride 0.9 % 100 mL IVPB  Status:  Discontinued     2 g 200 mL/hr over 30 Minutes Intravenous  Once 06/07/19 1928 06/07/19 1943   06/07/19 1930  metroNIDAZOLE (  FLAGYL) IVPB 500 mg     500 mg 100 mL/hr over 60 Minutes Intravenous  Once 06/07/19 1928 06/07/19 2155   06/07/19 1930  vancomycin (VANCOCIN) IVPB 1000 mg/200 mL premix  Status:  Discontinued     1,000 mg 200 mL/hr over 60 Minutes Intravenous  Once 06/07/19 1928 06/07/19 1943       Procedures: None  CONSULTS:  None  Time spent: 25 minutes-Greater than 50% of this time was spent in counseling, explanation of diagnosis, planning of further management, and coordination of care.  MEDICATIONS: Scheduled Meds: . ALPRAZolam  0.25 mg Oral TID  . divalproex  250 mg Oral BID  . donepezil  10 mg Oral QHS  . enoxaparin (LOVENOX) injection  30 mg Subcutaneous Q24H  . Melatonin  3 mg Oral QHS  . memantine  10 mg Oral BID  . sertraline  100 mg Oral Daily  . traZODone  25 mg Oral QHS   Continuous Infusions: . aztreonam 200 mL/hr at 06/09/19 0600  . dextrose Stopped (06/09/19 0543)  . metronidazole Stopped (06/09/19 0538)  . vancomycin Stopped (06/09/19 0105)   PRN Meds:.LORazepam, morphine injection, ondansetron **OR** ondansetron (ZOFRAN) IV   PHYSICAL EXAM: Vital signs: Vitals:   06/08/19 1951 06/09/19 0300 06/09/19 0500 06/09/19 0900  BP: 105/61 126/81  (!) 90/50  Pulse: 65 61  73  Resp: 15     Temp: 99.3 F (37.4 C) 99 F (37.2 C)  98.1 F (36.7 C)  TempSrc: Axillary Axillary  Oral  SpO2: 97% 95%  98%  Weight:   39 kg   Height:       Filed Weights   06/07/19 1655 06/08/19 0243 06/09/19 0500  Weight: 40.8 kg 38.6 kg 39 kg   Body mass index is 16.79 kg/m.   Gen Exam: Not in any distress-confused. HEENT:atraumatic, normocephalic Chest: B/L clear to auscultation anteriorly CVS:S1S2 regular Abdomen:soft non tender, non distended Extremities:no edema Neurology: Difficult exam-but moving all 4 extremities.   Skin: no rash  I have personally reviewed following labs and imaging studies  LABORATORY DATA:  CBC: Recent Labs  Lab 06/07/19 1710 06/07/19 2218 06/08/19 0554 06/09/19 0347  WBC 19.5* 16.6* 14.1* 14.1*  NEUTROABS 15.8*  --   --   --   HGB 16.2* 13.5 12.8 11.4*  HCT 53.1* 45.9 41.5 37.2  MCV 92.5 94.4 92.6 92.3  PLT 187 142* 98* 116*    Basic Metabolic Panel: Recent Labs  Lab 06/07/19 1710 06/07/19 2218 06/08/19 0554  06/09/19 0347  NA 154*  --  155* 150*  K 3.7  --  3.0* 2.9*  CL 112*  --  121* 114*  CO2 30  --  22 28  GLUCOSE 133*  --  176* 95  BUN 40*  --  31* 18  CREATININE 0.90 0.66 0.50 0.64  CALCIUM 9.0  --  7.8* 7.7*  MG  --   --   --  2.1    GFR: Estimated Creatinine Clearance: 39.1 mL/min (by C-G formula based on SCr of 0.64 mg/dL).  Liver Function Tests: Recent Labs  Lab 06/07/19 1710 06/09/19 0347  AST 26 17  ALT 21 14  ALKPHOS 58 41  BILITOT 0.9 0.3  PROT 7.1 4.5*  ALBUMIN 3.3* 2.0*   No results for input(s): LIPASE, AMYLASE in the last 168 hours. No results for input(s): AMMONIA in the last 168 hours.  Coagulation Profile: No results for input(s): INR, PROTIME in the last 168 hours.  Cardiac Enzymes: No  results for input(s): CKTOTAL, CKMB, CKMBINDEX, TROPONINI in the last 168 hours.  BNP (last 3 results) No results for input(s): PROBNP in the last 8760 hours.  Lipid Profile: No results for input(s): CHOL, HDL, LDLCALC, TRIG, CHOLHDL, LDLDIRECT in the last 72 hours.  Thyroid Function Tests: No results for input(s): TSH, T4TOTAL, FREET4, T3FREE, THYROIDAB in the last 72 hours.  Anemia Panel: No results for input(s): VITAMINB12, FOLATE, FERRITIN, TIBC, IRON, RETICCTPCT in the last 72 hours.  Urine analysis:    Component Value Date/Time   COLORURINE YELLOW 06/08/2019 0029   APPEARANCEUR CLEAR 06/08/2019 0029   LABSPEC 1.032 (H) 06/08/2019 0029   PHURINE 5.0 06/08/2019 0029   GLUCOSEU NEGATIVE 06/08/2019 0029   HGBUR NEGATIVE 06/08/2019 0029   BILIRUBINUR NEGATIVE 06/08/2019 0029   KETONESUR NEGATIVE 06/08/2019 0029   PROTEINUR 30 (A) 06/08/2019 0029   NITRITE NEGATIVE 06/08/2019 0029   LEUKOCYTESUR NEGATIVE 06/08/2019 0029    Sepsis Labs: Lactic Acid, Venous    Component Value Date/Time   LATICACIDVEN 1.7 06/07/2019 1715    MICROBIOLOGY: Recent Results (from the past 240 hour(s))  Culture, blood (Routine X 2) w Reflex to ID Panel     Status: None  (Preliminary result)   Collection Time: 06/07/19  5:00 PM   Specimen: BLOOD RIGHT ARM  Result Value Ref Range Status   Specimen Description BLOOD RIGHT ARM  Final   Special Requests   Final    BOTTLES DRAWN AEROBIC AND ANAEROBIC Blood Culture adequate volume   Culture   Final    NO GROWTH 2 DAYS Performed at [facility], [address], [place], [place] [address]    Report Status PENDING  Incomplete  Culture, blood (Routine X 2) w Reflex to ID Panel     Status: None (Preliminary result)   Collection Time: 06/07/19  5:05 PM   Specimen: BLOOD RIGHT ARM  Result Value Ref Range Status   Specimen Description BLOOD RIGHT ARM  Final   Special Requests   Final    BOTTLES DRAWN AEROBIC AND ANAEROBIC Blood Culture adequate volume   Culture   Final    NO GROWTH 2 DAYS Performed at [facility], [address], [place], [place] [address]    Report Status PENDING  Incomplete  SARS CORONAVIRUS 2 (TAT 6-24 HRS) Nasopharyngeal Nasopharyngeal Swab     Status: None   Collection Time: 06/07/19  8:39 PM   Specimen: Nasopharyngeal Swab  Result Value Ref Range Status   SARS Coronavirus 2 NEGATIVE NEGATIVE Final    Comment: (NOTE) SARS-CoV-2 target nucleic acids are NOT DETECTED. The SARS-CoV-2 RNA is generally detectable in upper and lower respiratory specimens during the acute phase of infection. Negative results do not preclude SARS-CoV-2 infection, do not rule out co-infections with other pathogens, and should not be used as the sole basis for treatment or other patient management decisions. Negative results must be combined with clinical observations, patient history, and epidemiological information. The expected result is Negative. Fact Sheet for Patients: HairSlick.no Fact Sheet for Healthcare Providers: quierodirigir.com This test is not yet approved or cleared by the [place] FDA and  has been authorized for  detection and/or diagnosis of SARS-CoV-2 by FDA under an Emergency Use Authorization (EUA). This EUA will remain  in effect (meaning this test can be used) for the duration of the COVID-19 declaration under Section 56 4(b)(1) of the Act, 21 U.S.C. section 360bbb-3(b)(1), unless the authorization is terminated or revoked sooner. Performed at [facility], [address], [place], [place] [address]     RADIOLOGY STUDIES/RESULTS: DG Chest Port 1 View  Result Date: 06/07/2019 CLINICAL DATA:  Cough, altered mental status EXAM: PORTABLE CHEST 1 VIEW COMPARISON:  None. FINDINGS: No consolidation, features of edema, pneumothorax, or effusion. The cardiomediastinal contours are unremarkable. No acute osseous or soft tissue abnormality. Degenerative changes are present in the imaged spine and shoulders. Telemetry leads overlie the chest. IMPRESSION: No acute cardiopulmonary disease. Electronically Signed   By: Kreg Shropshire M.D.   On: 06/07/2019 17:20     LOS: 2 days   Jeoffrey Massed, MD  [facility]    To contact the attending provider between 7A-7P or the covering provider during after hours 7P-7A, please log into the web site [email or web address] and access using universal [place] password for that web site. If you do not have the password, please call the hospital operator.  06/09/2019, 10:49 AM

## 2019-06-10 LAB — CBC
HCT: 38.1 % (ref 36.0–46.0)
Hemoglobin: 12.1 g/dL (ref 12.0–15.0)
MCH: 28.3 pg (ref 26.0–34.0)
MCHC: 31.8 g/dL (ref 30.0–36.0)
MCV: 89.2 fL (ref 80.0–100.0)
Platelets: 127 10*3/uL — ABNORMAL LOW (ref 150–400)
RBC: 4.27 MIL/uL (ref 3.87–5.11)
RDW: 13 % (ref 11.5–15.5)
WBC: 11.8 10*3/uL — ABNORMAL HIGH (ref 4.0–10.5)
nRBC: 0 % (ref 0.0–0.2)

## 2019-06-10 LAB — BASIC METABOLIC PANEL
Anion gap: 7 (ref 5–15)
BUN: 12 mg/dL (ref 8–23)
CO2: 25 mmol/L (ref 22–32)
Calcium: 7.7 mg/dL — ABNORMAL LOW (ref 8.9–10.3)
Chloride: 112 mmol/L — ABNORMAL HIGH (ref 98–111)
Creatinine, Ser: 0.39 mg/dL — ABNORMAL LOW (ref 0.44–1.00)
GFR calc Af Amer: >60 mL/min (ref >60)
GFR calc non Af Amer: >60 mL/min (ref >60)
Glucose, Bld: 100 mg/dL — ABNORMAL HIGH (ref 70–99)
Potassium: 3.2 mmol/L — ABNORMAL LOW (ref 3.5–5.1)
Sodium: 144 mmol/L (ref 135–145)

## 2019-06-10 MED ORDER — POTASSIUM CHLORIDE CRYS ER 20 MEQ PO TBCR
40.0000 meq | EXTENDED_RELEASE_TABLET | ORAL | Status: AC
  Administered 2019-06-10 (×2): 40 meq via ORAL
  Filled 2019-06-10 (×2): qty 2

## 2019-06-10 MED ORDER — ENSURE ENLIVE PO LIQD
237.0000 mL | Freq: Two times a day (BID) | ORAL | Status: DC
  Administered 2019-06-11 – 2019-06-13 (×2): 237 mL via ORAL

## 2019-06-10 MED ORDER — ADULT MULTIVITAMIN W/MINERALS CH
1.0000 | ORAL_TABLET | Freq: Every day | ORAL | Status: DC
  Administered 2019-06-11 – 2019-06-13 (×2): 1 via ORAL
  Filled 2019-06-10 (×3): qty 1

## 2019-06-10 NOTE — Progress Notes (Signed)
PROGRESS NOTE        PATIENT DETAILS Name: Sheri Lucas Age: 74 y.o. Sex: female Date of Birth: 1945/09/05 Admit Date: 06/07/2019 Admitting Physician Haydee Monica, MD [id number], Wille Celeste, DO  Brief Narrative: Patient is a 74 y.o. female with history of advanced dementia brought in from assisted living facility for evaluation of worsening confusion and fever.  See below for further details.  Antimicrobial therapy: Aztreonam: 3/5>> Vancomycin: 3/5>>3/7 Flagyl: 3/5>>3/7  Microbiology data 3/5>>Blood cultures-negative  3/5>> influenza PCR negative 3/5>> Covid negative.  Subjective: Lying comfortably in bed-more alert-mumbles her name when asked.  But really does not follow other commands.  Appears weak and lethargic.  Assessment/Plan: Systemic inflammatory response syndrome: Improved-afebrile-blood cultures continue to be negative.  UA/chest x-ray/Covid/influenza studies negative.  Continue IV aztreonam for a few more days.  No longer on vancomycin and Flagyl.   Hypernatremia: Secondary to dehydration-improving-resolved with IV fluids.  Stop D5W-encourage oral intake.  Hypokalemia: Replete and recheck.  Acute metabolic encephalopathy superimposed on advanced dementia: Encephalopathy likely secondary to hyponatremia and fever/infection.  Although slightly more awake compared to the past few days-still very weak and lethargic.  Not sure how different if she from her usual baseline.  Thrombocytopenia: Likely secondary to infection/SIRS.  Improving-follow closely-on Lovenox at prophylactic dose.  Dementia: Appears to be advanced-resume Aricept/Namenda/Depakote-Zoloft and trazodone  Goals of care: DNR in place.  Have had numerous discussions with the patient's spouse on a daily basis since admission.  Goals are for gentle medical treatment.  Family does not desire aggressive care or ICU level care if patient worsens.  Since her febrile illness and  electrolyte abnormalities are much better-after discussion with family-we plan to watch her for another day or so without any IV fluids-if she is able to drink/eat and sustain electrolytes then she will be discharged back to SNF with hospice follow-up.  If her electrolytes worsen then we will involve hospice during this hospitalization.  Diet: Diet Order            DIET - DYS 1 Room service appropriate? No; Fluid consistency: Thin  Diet effective now               DVT Prophylaxis: Prophylactic Lovenox   Code Status:  DNR  Family Communication: Spouse over the phone on 3/8  Disposition Plan: Back to ALF/memory care if clinically improved.  If deteriorates-and then may require hospice care.  See goals of care discussion.  Barriers to Discharge: Still encephalopathic-on IV antibiotics.  See goals of care discussion.  Antimicrobial agents: Anti-infectives (From admission, onward)   Start     Dose/Rate Route Frequency Ordered Stop   06/08/19 2100  vancomycin (VANCOREADY) IVPB 500 mg/100 mL  Status:  Discontinued     500 mg 100 mL/hr over 60 Minutes Intravenous Every 24 hours 06/07/19 1946 06/09/19 1109   06/08/19 0500  metroNIDAZOLE (FLAGYL) IVPB 500 mg  Status:  Discontinued     500 mg 100 mL/hr over 60 Minutes Intravenous Every 8 hours 06/07/19 2100 06/09/19 1109   06/07/19 2000  vancomycin (VANCOREADY) IVPB 750 mg/150 mL     750 mg 150 mL/hr over 60 Minutes Intravenous  Once 06/07/19 1944 06/07/19 2308   06/07/19 2000  aztreonam (AZACTAM) 1 g in sodium chloride 0.9 % 100 mL IVPB     1 g 200 mL/hr over 30  Minutes Intravenous Every 8 hours 06/07/19 1944     06/07/19 1930  aztreonam (AZACTAM) 2 g in sodium chloride 0.9 % 100 mL IVPB  Status:  Discontinued     2 g 200 mL/hr over 30 Minutes Intravenous  Once 06/07/19 1928 06/07/19 1943   06/07/19 1930  metroNIDAZOLE (FLAGYL) IVPB 500 mg     500 mg 100 mL/hr over 60 Minutes Intravenous  Once 06/07/19 1928 06/07/19 2155    06/07/19 1930  vancomycin (VANCOCIN) IVPB 1000 mg/200 mL premix  Status:  Discontinued     1,000 mg 200 mL/hr over 60 Minutes Intravenous  Once 06/07/19 1928 06/07/19 1943      Procedures: None  CONSULTS:  None  Time spent: 25 minutes-Greater than 50% of this time was spent in counseling, explanation of diagnosis, planning of further management, and coordination of care.  MEDICATIONS: Scheduled Meds: . ALPRAZolam  0.25 mg Oral TID  . divalproex  250 mg Oral BID  . donepezil  10 mg Oral QHS  . enoxaparin (LOVENOX) injection  30 mg Subcutaneous Q24H  . Melatonin  3 mg Oral QHS  . memantine  10 mg Oral BID  . potassium chloride  40 mEq Oral Q4H  . sertraline  100 mg Oral Daily  . traZODone  25 mg Oral QHS   Continuous Infusions: . aztreonam 1 g (06/10/19 0533)   PRN Meds:.LORazepam, morphine injection, ondansetron **OR** ondansetron (ZOFRAN) IV   PHYSICAL EXAM: Vital signs: Vitals:   06/09/19 1922 06/10/19 0352 06/10/19 0500 06/10/19 0747  BP: 112/62 (!) 104/54  116/61  Pulse: 66 60  65  Resp: 20 16  14   Temp: 98.4 F (36.9 C) 98.4 F (36.9 C)  97.9 F (36.6 C)  TempSrc: Oral Oral  Oral  SpO2: 100%   100%  Weight:   39.2 kg   Height:       Filed Weights   06/08/19 0243 06/09/19 0500 06/10/19 0500  Weight: 38.6 kg 39 kg 39.2 kg   Body mass index is 16.88 kg/m.   Gen Exam: Pleasantly confused-lethargic but not in any distress. HEENT:atraumatic, normocephalic Chest: B/L clear to auscultation anteriorly CVS:S1S2 regular Abdomen:soft non tender, non distended Extremities:no edema Neurology: Difficult exam-has generalized weakness but appears nonfocal Skin: no rash  I have personally reviewed following labs and imaging studies  LABORATORY DATA:  CBC: Recent Labs  Lab 06/07/19 1710 06/07/19 2218 06/08/19 0554 06/09/19 0347 06/10/19 0219  WBC 19.5* 16.6* 14.1* 14.1* 11.8*  NEUTROABS 15.8*  --   --   --   --   HGB 16.2* 13.5 12.8 11.4* 12.1  HCT  53.1* 45.9 41.5 37.2 38.1  MCV 92.5 94.4 92.6 92.3 89.2  PLT 187 142* 98* 116* 127*    Basic Metabolic Panel: Recent Labs  Lab 06/07/19 1710 06/07/19 2218 06/08/19 0554 06/09/19 0347 06/10/19 0219  NA 154*  --  155* 150* 144  K 3.7  --  3.0* 2.9* 3.2*  CL 112*  --  121* 114* 112*  CO2 30  --  22 28 25   GLUCOSE 133*  --  176* 95 100*  BUN 40*  --  31* 18 12  CREATININE 0.90 0.66 0.50 0.64 0.39*  CALCIUM 9.0  --  7.8* 7.7* 7.7*  MG  --   --   --  2.1  --     GFR: Estimated Creatinine Clearance: 39.3 mL/min (A) (by C-G formula based on SCr of 0.39 mg/dL (L)).  Liver Function Tests: Recent Labs  Lab 06/07/19 1710 06/09/19 0347  AST 26 17  ALT 21 14  ALKPHOS 58 41  BILITOT 0.9 0.3  PROT 7.1 4.5*  ALBUMIN 3.3* 2.0*   No results for input(s): LIPASE, AMYLASE in the last 168 hours. No results for input(s): AMMONIA in the last 168 hours.  Coagulation Profile: No results for input(s): INR, PROTIME in the last 168 hours.  Cardiac Enzymes: No results for input(s): CKTOTAL, CKMB, CKMBINDEX, TROPONINI in the last 168 hours.  BNP (last 3 results) No results for input(s): PROBNP in the last 8760 hours.  Lipid Profile: No results for input(s): CHOL, HDL, LDLCALC, TRIG, CHOLHDL, LDLDIRECT in the last 72 hours.  Thyroid Function Tests: No results for input(s): TSH, T4TOTAL, FREET4, T3FREE, THYROIDAB in the last 72 hours.  Anemia Panel: No results for input(s): VITAMINB12, FOLATE, FERRITIN, TIBC, IRON, RETICCTPCT in the last 72 hours.  Urine analysis:    Component Value Date/Time   COLORURINE YELLOW 06/08/2019 0029   APPEARANCEUR CLEAR 06/08/2019 0029   LABSPEC 1.032 (H) 06/08/2019 0029   PHURINE 5.0 06/08/2019 0029   GLUCOSEU NEGATIVE 06/08/2019 0029   HGBUR NEGATIVE 06/08/2019 0029   BILIRUBINUR NEGATIVE 06/08/2019 0029   KETONESUR NEGATIVE 06/08/2019 0029   PROTEINUR 30 (A) 06/08/2019 0029   NITRITE NEGATIVE 06/08/2019 0029   LEUKOCYTESUR NEGATIVE 06/08/2019  0029    Sepsis Labs: Lactic Acid, Venous    Component Value Date/Time   LATICACIDVEN 1.7 06/07/2019 1715    MICROBIOLOGY: Recent Results (from the past 240 hour(s))  Culture, blood (Routine X 2) w Reflex to ID Panel     Status: None (Preliminary result)   Collection Time: 06/07/19  5:00 PM   Specimen: BLOOD RIGHT ARM  Result Value Ref Range Status   Specimen Description BLOOD RIGHT ARM  Final   Special Requests   Final    BOTTLES DRAWN AEROBIC AND ANAEROBIC Blood Culture adequate volume   Culture   Final    NO GROWTH 3 DAYS Performed at [facility], [address], [place], [place] [address]    Report Status PENDING  Incomplete  Culture, blood (Routine X 2) w Reflex to ID Panel     Status: None (Preliminary result)   Collection Time: 06/07/19  5:05 PM   Specimen: BLOOD RIGHT ARM  Result Value Ref Range Status   Specimen Description BLOOD RIGHT ARM  Final   Special Requests   Final    BOTTLES DRAWN AEROBIC AND ANAEROBIC Blood Culture adequate volume   Culture   Final    NO GROWTH 3 DAYS Performed at [facility], [address] [place], [place] [address]    Report Status PENDING  Incomplete  SARS CORONAVIRUS 2 (TAT 6-24 HRS) Nasopharyngeal Nasopharyngeal Swab     Status: None   Collection Time: 06/07/19  8:39 PM   Specimen: Nasopharyngeal Swab  Result Value Ref Range Status   SARS Coronavirus 2 NEGATIVE NEGATIVE Final    Comment: (NOTE) SARS-CoV-2 target nucleic acids are NOT DETECTED. The SARS-CoV-2 RNA is generally detectable in upper and lower respiratory specimens during the acute phase of infection. Negative results do not preclude SARS-CoV-2 infection, do not rule out co-infections with other pathogens, and should not be used as the sole basis for treatment or other patient management decisions. Negative results must be combined with clinical observations, patient history, and epidemiological information. The expected result is Negative. Fact  Sheet for Patients: HairSlick.no Fact Sheet for Healthcare Providers: quierodirigir.com This test is not  yet approved or cleared by the [place] and  has been authorized for detection and/or diagnosis of SARS-CoV-2 by FDA under an Emergency Use Authorization (EUA). This EUA will remain  in effect (meaning this test can be used) for the duration of the COVID-19 declaration under Section 56 4(b)(1) of the Act, 21 U.S.C. section 360bbb-3(b)(1), unless the authorization is terminated or revoked sooner. Performed at [facility], [place] [address] [place], [place] [address]     RADIOLOGY STUDIES/RESULTS: No results found.   LOS: 3 days   Oren Binet, MD  [facility]    To contact the attending provider between 7A-7P or the covering provider during after hours 7P-7A, please log into the web site [email or web address] and access using universal [place] password for that web site. If you do not have the password, please call the hospital operator.  06/10/2019, 11:49 AM

## 2019-06-10 NOTE — Plan of Care (Signed)
  Problem: Safety: Goal: Ability to remain free from injury will improve Outcome: Progressing   

## 2019-06-10 NOTE — Progress Notes (Signed)
Initial Nutrition Assessment  RD working remotely.  DOCUMENTATION CODES:   Underweight  INTERVENTION:   -Feeding assistance with meals -MVI with minerals daily -Ensure Enlive po BID, each supplement provides 350 kcal and 20 grams of protein -Magic cup TID with meals, each supplement provides 290 kcal and 9 grams of protein  NUTRITION DIAGNOSIS:   Inadequate oral intake related to poor appetite as evidenced by meal completion < 25%.  GOAL:   Patient will meet greater than or equal to 90% of their needs  MONITOR:   PO intake, Supplement acceptance, Diet advancement, Labs, Weight trends, Skin, I & O's  REASON FOR ASSESSMENT:   Low Braden    ASSESSMENT:   Sheri Lucas is a 74 y.o. female with medical history significant of advanced dementia brought in today because her husband went to visit her today and she was very sedated and sleepy not responding as normal.  At her baseline she is nonverbal mostly and nonambulatory bedbound.  They have adjusted her medications recently because she was screaming a lot was unclear whether or not she was in pain or was just scared.  Since they have adjusted her medications she has been sleeping a lot and not eating and drinking very well.  Husband reports no nausea vomiting or diarrhea.  Husband is at baseline.  Husband reports she has been deteriorating for months.  They are in the process of switching to hospice at the nursing home.  Patient is DNR.  Patient was noted to have a fever of 101 and sent to the emergency department.  Chest x-ray is negative.  Urinalysis is pending.  Patient is very dehydrated.  Patient be referred for admission for dehydration.  Pt admitted with failure to thrive, dehydration, and SIRS.   3/8- s/p BSE- advanced to dysphagia 1 diet with thin liquids  Reviewed I/O's: +1.1 L x 24 hours and +4.8 L since admission  Attempted to speak with pt via phone, however, no answer.   Pt with poor oral intake; noted meal  completion 10-25%. She was residing in ALF PTA.   Reviewed wt hx; noted pt has experienced a 16% wt loss over the past 8 months, which is significant for time frame. Given underweight status, dementia, and significant wt loss, RD highly suspects malnutrition, however, unable to identify at this time. RD would greatly benefit from addition of oral nutritional supplements.   Labs reviewed: K: 3.2.   Diet Order:   Diet Order            DIET - DYS 1 Room service appropriate? No; Fluid consistency: Thin  Diet effective now              EDUCATION NEEDS:   No education needs have been identified at this time  Skin:  Skin Assessment: Reviewed RN Assessment  Last BM:  06/09/19  Height:   Ht Readings from Last 1 Encounters:  06/07/19 5' (1.524 m)    Weight:   Wt Readings from Last 1 Encounters:  06/10/19 39.2 kg    Ideal Body Weight:  45.5 kg  BMI:  Body mass index is 16.88 kg/m.  Estimated Nutritional Needs:   Kcal:  1400-1600  Protein:  65-80 grams  Fluid:  > 1.4 L    Levada Schilling, RD, LDN, CDCES Registered Dietitian II Certified Diabetes Care and Education Specialist Please refer to [facility] for RD and/or RD on-call/weekend/after hours pager

## 2019-06-10 NOTE — Plan of Care (Signed)
  Problem: Safety: Goal: Ability to remain free from injury will improve Outcome: Progressing   Problem: Skin Integrity: Goal: Risk for impaired skin integrity will decrease Outcome: Progressing   

## 2019-06-10 NOTE — Progress Notes (Signed)
Pharmacy Antibiotic Note Sheri Lucas is a 74 y.o. female admitted on 06/07/2019 with failure to thrive. Pharmacy was consulted on 3/5 for aztreonam dosing.  -afebrile. WBC trending down 14 > 11.8  Blood cultures continue to be negative.  UA/chest x-ray/Covid/influenza studies negative. SCr 0.64>0.39.  MD noted plan to continue IV aztreonam for a few more days   Plan: Continue IV Aztreonam 1g IV Q8h F/u renal function, clinical progress, cultures, LOT  Height: 5' (152.4 cm) Weight: 86 lb 6.7 oz (39.2 kg) IBW/kg (Calculated) : 45.5  Temp (24hrs), Avg:98 F (36.7 C), Min:97.4 F (36.3 C), Max:98.4 F (36.9 C)  Recent Labs  Lab 06/07/19 1710 06/07/19 1715 06/07/19 2218 06/08/19 0554 06/09/19 0347 06/10/19 0219  WBC 19.5*  --  16.6* 14.1* 14.1* 11.8*  CREATININE 0.90  --  0.66 0.50 0.64 0.39*  LATICACIDVEN  --  1.7  --   --   --   --     Estimated Creatinine Clearance: 39.3 mL/min (A) (by C-G formula based on SCr of 0.39 mg/dL (L)).    Allergies  Allergen Reactions  . Amoxicillin Other (See Comments)    Unknown reaction Did it involve swelling of the face/tongue/throat, SOB, or low BP? Unknown Did it involve sudden or severe rash/hives, skin peeling, or any reaction on the inside of your mouth or nose? Unknown Did you need to seek medical attention at a hospital or doctor's office? Unknown When did it last happen?unknown - more than 52 yrs ago per spouse If all above answers are "NO", may proceed with cephalosporin use.  Marland Kitchen Keflex [Cephalexin] Other (See Comments)    Unknown reaction  . Penicillins Other (See Comments)    Unknown reaction (per MAR)Did it involve swelling of the face/tongue/throat, SOB, or low BP? Unknown Did it involve sudden or severe rash/hives, skin peeling, or any reaction on the inside of your mouth or nose? Unknown Did you need to seek medical attention at a hospital or doctor's office? Unknown When did it last happen?unknown - more than 52  yrs ago per spouse If all above answers are "NO", may proceed with cephalosporin use.     Antimicrobials this admission: 3/5 vancomycin >> 3/7 3/5 aztreonam >>  3/5 flagyl x1  Dose adjustments this admission: N/A  Microbiology results: 3/5 MBW:GYKZ x 3 days 3/5 UCx: needs collected  3/5 Covid: neg   Thank you for allowing pharmacy to be a part of this patient's care.  Noah Delaine, RPh Clinical Pharmacist [phone] Please check AMION for all [facility] Pharmacy phone numbers After 10:00 PM, call Main Pharmacy [phone] 06/10/2019 4:48 PM

## 2019-06-11 ENCOUNTER — Encounter (HOSPITAL_COMMUNITY): Payer: Self-pay | Admitting: Family Medicine

## 2019-06-11 LAB — BASIC METABOLIC PANEL
Anion gap: 7 (ref 5–15)
BUN: 8 mg/dL (ref 8–23)
CO2: 27 mmol/L (ref 22–32)
Calcium: 8.1 mg/dL — ABNORMAL LOW (ref 8.9–10.3)
Chloride: 106 mmol/L (ref 98–111)
Creatinine, Ser: 0.4 mg/dL — ABNORMAL LOW (ref 0.44–1.00)
GFR calc Af Amer: >60 mL/min (ref >60)
GFR calc non Af Amer: >60 mL/min (ref >60)
Glucose, Bld: 107 mg/dL — ABNORMAL HIGH (ref 70–99)
Potassium: 3.7 mmol/L (ref 3.5–5.1)
Sodium: 140 mmol/L (ref 135–145)

## 2019-06-11 LAB — CBC
HCT: 38.6 % (ref 36.0–46.0)
Hemoglobin: 12.5 g/dL (ref 12.0–15.0)
MCH: 28.3 pg (ref 26.0–34.0)
MCHC: 32.4 g/dL (ref 30.0–36.0)
MCV: 87.3 fL (ref 80.0–100.0)
Platelets: 190 10*3/uL (ref 150–400)
RBC: 4.42 MIL/uL (ref 3.87–5.11)
RDW: 12.9 % (ref 11.5–15.5)
WBC: 10.5 10*3/uL (ref 4.0–10.5)
nRBC: 0 % (ref 0.0–0.2)

## 2019-06-11 LAB — MAGNESIUM: Magnesium: 2.2 mg/dL (ref 1.7–2.4)

## 2019-06-11 NOTE — Progress Notes (Signed)
PROGRESS NOTE        PATIENT DETAILS Name: FIANNA SNOWBALL Age: 74 y.o. Sex: female Date of Birth: 1945-08-02 Admit Date: 06/07/2019 Admitting Physician Phillips Grout, MD OEV:[id number], Adele Barthel, DO  Brief Narrative: Patient is a 74 y.o. female with history of advanced dementia brought in from assisted living facility for evaluation of worsening confusion and fever.  See below for further details.  Antimicrobial therapy: Aztreonam: 3/5>> Vancomycin: 3/5>>3/7 Flagyl: 3/5>>3/7  Microbiology data 3/5>>Blood cultures-negative  3/5>> influenza PCR negative 3/5>> Covid negative.  Subjective: Lying comfortably in bed-keeps eyes closed-but able to answer a few questions appropriately with 1-2 word sentences.  Assessment/Plan: Systemic inflammatory response syndrome: SIRS physiology has resolved-blood cultures negative.  UA/chest x-ray/Covid/influenza studies negative.  Will stop aztreonam after 5 days of treatment.  No longer on vancomycin and Flagyl.   Hypernatremia: Secondary to dehydration-IV fluids.  All IV fluids were discontinued on 3/8-sodium levels remain normal.  Continue to encourage oral intake.  Hypokalemia: Repleted.  Acute metabolic encephalopathy superimposed on advanced dementia: Encephalopathy likely secondary to hypernatremia and fever/infection.  Overall improved but still with generalized weakness and some amount of lethargy-not sure what her baseline is.  Thrombocytopenia: Likely secondary to infection/SIRS.  Improving-follow closely-on Lovenox at prophylactic dose.  Dementia: Appears to be advanced-resume Aricept/Namenda/Depakote-Zoloft and trazodone  Goals of care: DNR in place.  Have had numerous discussions with the patient's spouse on a daily basis since admission.  Overall goals of care are for gentle medical treatment.  Family does not desire aggressive or ICU level of care.  At this point-she will complete 5 days of IV antibiotics  today-we will watch her for another day-if she remains stable-plans are to discharge to SNF with hospice follow-up.  If she deteriorates in the interim-we will transition to comfort care-and consider residential hospice.  Husband agreeable with this plan-I have spoken with case management to see if we can get her back to SNF with hospice follow-up tomorrow.    Diet: Diet Order            DIET - DYS 1 Room service appropriate? No; Fluid consistency: Thin  Diet effective now               DVT Prophylaxis: Prophylactic Lovenox   Code Status:  DNR  Family Communication: Spouse over the phone on 3/9  Disposition Plan: Back to ALF/memory care if clinically improved.  If deteriorates-and then may require hospice care.  See goals of care discussion.  Barriers to Discharge: Still encephalopathic-on IV antibiotics.  See goals of care discussion.  Antimicrobial agents: Anti-infectives (From admission, onward)   Start     Dose/Rate Route Frequency Ordered Stop   06/08/19 2100  vancomycin (VANCOREADY) IVPB 500 mg/100 mL  Status:  Discontinued     500 mg 100 mL/hr over 60 Minutes Intravenous Every 24 hours 06/07/19 1946 06/09/19 1109   06/08/19 0500  metroNIDAZOLE (FLAGYL) IVPB 500 mg  Status:  Discontinued     500 mg 100 mL/hr over 60 Minutes Intravenous Every 8 hours 06/07/19 2100 06/09/19 1109   06/07/19 2000  vancomycin (VANCOREADY) IVPB 750 mg/150 mL     750 mg 150 mL/hr over 60 Minutes Intravenous  Once 06/07/19 1944 06/07/19 2308   06/07/19 2000  aztreonam (AZACTAM) 1 g in sodium chloride 0.9 % 100 mL IVPB  1 g 200 mL/hr over 30 Minutes Intravenous Every 8 hours 06/07/19 1944     06/07/19 1930  aztreonam (AZACTAM) 2 g in sodium chloride 0.9 % 100 mL IVPB  Status:  Discontinued     2 g 200 mL/hr over 30 Minutes Intravenous  Once 06/07/19 1928 06/07/19 1943   06/07/19 1930  metroNIDAZOLE (FLAGYL) IVPB 500 mg     500 mg 100 mL/hr over 60 Minutes Intravenous  Once 06/07/19 1928  06/07/19 2155   06/07/19 1930  vancomycin (VANCOCIN) IVPB 1000 mg/200 mL premix  Status:  Discontinued     1,000 mg 200 mL/hr over 60 Minutes Intravenous  Once 06/07/19 1928 06/07/19 1943      Procedures: None  CONSULTS:  None  Time spent: 25 minutes-Greater than 50% of this time was spent in counseling, explanation of diagnosis, planning of further management, and coordination of care.  MEDICATIONS: Scheduled Meds: . ALPRAZolam  0.25 mg Oral TID  . divalproex  250 mg Oral BID  . donepezil  10 mg Oral QHS  . enoxaparin (LOVENOX) injection  30 mg Subcutaneous Q24H  . feeding supplement (ENSURE ENLIVE)  237 mL Oral BID BM  . Melatonin  3 mg Oral QHS  . memantine  10 mg Oral BID  . multivitamin with minerals  1 tablet Oral Daily  . sertraline  100 mg Oral Daily  . traZODone  25 mg Oral QHS   Continuous Infusions: . aztreonam 1 g (06/11/19 0515)   PRN Meds:.LORazepam, morphine injection, ondansetron **OR** ondansetron (ZOFRAN) IV   PHYSICAL EXAM: Vital signs: Vitals:   06/10/19 1924 06/11/19 0343 06/11/19 0500 06/11/19 0726  BP: (!) 141/64 110/61  91/60  Pulse: 76 70  80  Resp: 15 15  16   Temp: 97.8 F (36.6 C) 98.5 F (36.9 C)  98.4 F (36.9 C)  TempSrc: Oral Oral  Oral  SpO2: 100%   97%  Weight:   39 kg   Height:       Filed Weights   06/09/19 0500 06/10/19 0500 06/11/19 0500  Weight: 39 kg 39.2 kg 39 kg   Body mass index is 16.79 kg/m.   Gen Exam: Pleasantly confused-lethargic but not in any distress. HEENT:atraumatic, normocephalic Chest: B/L clear to auscultation anteriorly CVS:S1S2 regular Abdomen:soft non tender, non distended Extremities:no edema Neurology: Difficult exam-has generalized weakness but appears nonfocal Skin: no rash  I have personally reviewed following labs and imaging studies  LABORATORY DATA:  CBC: Recent Labs  Lab 06/07/19 1710 06/07/19 1710 06/07/19 2218 06/08/19 0554 06/09/19 0347 06/10/19 0219 06/11/19 0427  WBC  19.5*   < > 16.6* 14.1* 14.1* 11.8* 10.5  NEUTROABS 15.8*  --   --   --   --   --   --   HGB 16.2*   < > 13.5 12.8 11.4* 12.1 12.5  HCT 53.1*   < > 45.9 41.5 37.2 38.1 38.6  MCV 92.5   < > 94.4 92.6 92.3 89.2 87.3  PLT 187   < > 142* 98* 116* 127* 190   < > = values in this interval not displayed.    Basic Metabolic Panel: Recent Labs  Lab 06/07/19 1710 06/07/19 1710 06/07/19 2218 06/08/19 0554 06/09/19 0347 06/10/19 0219 06/11/19 0427  NA 154*  --   --  155* 150* 144 140  K 3.7  --   --  3.0* 2.9* 3.2* 3.7  CL 112*  --   --  121* 114* 112* 106  CO2 30  --   --  22 28 25 27   GLUCOSE 133*  --   --  176* 95 100* 107*  BUN 40*  --   --  31* 18 12 8   CREATININE 0.90   < > 0.66 0.50 0.64 0.39* 0.40*  CALCIUM 9.0  --   --  7.8* 7.7* 7.7* 8.1*  MG  --   --   --   --  2.1  --  2.2   < > = values in this interval not displayed.    GFR: Estimated Creatinine Clearance: 39.1 mL/min (A) (by C-G formula based on SCr of 0.4 mg/dL (L)).  Liver Function Tests: Recent Labs  Lab 06/07/19 1710 06/09/19 0347  AST 26 17  ALT 21 14  ALKPHOS 58 41  BILITOT 0.9 0.3  PROT 7.1 4.5*  ALBUMIN 3.3* 2.0*   No results for input(s): LIPASE, AMYLASE in the last 168 hours. No results for input(s): AMMONIA in the last 168 hours.  Coagulation Profile: No results for input(s): INR, PROTIME in the last 168 hours.  Cardiac Enzymes: No results for input(s): CKTOTAL, CKMB, CKMBINDEX, TROPONINI in the last 168 hours.  BNP (last 3 results) No results for input(s): PROBNP in the last 8760 hours.  Lipid Profile: No results for input(s): CHOL, HDL, LDLCALC, TRIG, CHOLHDL, LDLDIRECT in the last 72 hours.  Thyroid Function Tests: No results for input(s): TSH, T4TOTAL, FREET4, T3FREE, THYROIDAB in the last 72 hours.  Anemia Panel: No results for input(s): VITAMINB12, FOLATE, FERRITIN, TIBC, IRON, RETICCTPCT in the last 72 hours.  Urine analysis:    Component Value Date/Time   COLORURINE YELLOW  06/08/2019 0029   APPEARANCEUR CLEAR 06/08/2019 0029   LABSPEC 1.032 (H) 06/08/2019 0029   PHURINE 5.0 06/08/2019 0029   GLUCOSEU NEGATIVE 06/08/2019 0029   HGBUR NEGATIVE 06/08/2019 0029   BILIRUBINUR NEGATIVE 06/08/2019 0029   KETONESUR NEGATIVE 06/08/2019 0029   PROTEINUR 30 (A) 06/08/2019 0029   NITRITE NEGATIVE 06/08/2019 0029   LEUKOCYTESUR NEGATIVE 06/08/2019 0029    Sepsis Labs: Lactic Acid, Venous    Component Value Date/Time   LATICACIDVEN 1.7 06/07/2019 1715    MICROBIOLOGY: Recent Results (from the past 240 hour(s))  Culture, blood (Routine X 2) w Reflex to ID Panel     Status: None (Preliminary result)   Collection Time: 06/07/19  5:00 PM   Specimen: BLOOD RIGHT ARM  Result Value Ref Range Status   Specimen Description BLOOD RIGHT ARM  Final   Special Requests   Final    BOTTLES DRAWN AEROBIC AND ANAEROBIC Blood Culture adequate volume   Culture   Final    NO GROWTH 3 DAYS Performed at [facility], [address], [place], [address] [place]    Report Status PENDING  Incomplete  Culture, blood (Routine X 2) w Reflex to ID Panel     Status: None (Preliminary result)   Collection Time: 06/07/19  5:05 PM   Specimen: BLOOD RIGHT ARM  Result Value Ref Range Status   Specimen Description BLOOD RIGHT ARM  Final   Special Requests   Final    BOTTLES DRAWN AEROBIC AND ANAEROBIC Blood Culture adequate volume   Culture   Final    NO GROWTH 3 DAYS Performed at [facility], [address], [place], [address] [place]    Report Status PENDING  Incomplete  SARS CORONAVIRUS 2 (TAT 6-24 HRS) Nasopharyngeal Nasopharyngeal Swab     Status: None   Collection Time: 06/07/19  8:39 PM  Specimen: Nasopharyngeal Swab  Result Value Ref Range Status   SARS Coronavirus 2 NEGATIVE NEGATIVE Final    Comment: (NOTE) SARS-CoV-2 target nucleic acids are NOT DETECTED. The SARS-CoV-2 RNA is generally detectable in upper and lower respiratory specimens during the acute  phase of infection. Negative results do not preclude SARS-CoV-2 infection, do not rule out co-infections with other pathogens, and should not be used as the sole basis for treatment or other patient management decisions. Negative results must be combined with clinical observations, patient history, and epidemiological information. The expected result is Negative. Fact Sheet for Patients: HairSlick.no Fact Sheet for Healthcare Providers: quierodirigir.com This test is not yet approved or cleared by the [place] FDA and  has been authorized for detection and/or diagnosis of SARS-CoV-2 by FDA under an Emergency Use Authorization (EUA). This EUA will remain  in effect (meaning this test can be used) for the duration of the COVID-19 declaration under Section 56 4(b)(1) of the Act, 21 U.S.C. section 360bbb-3(b)(1), unless the authorization is terminated or revoked sooner. Performed at [facility], [address] [place], [place] [address]     RADIOLOGY STUDIES/RESULTS: No results found.   LOS: 4 days   Jeoffrey Massed, MD  [facility]    To contact the attending provider between 7A-7P or the covering provider during after hours 7P-7A, please log into the web site [email or web address] and access using universal [place] password for that web site. If you do not have the password, please call the hospital operator.  06/11/2019, 11:49 AM

## 2019-06-11 NOTE — Progress Notes (Addendum)
NCM spoke with pt's husband Gerlene Burdock [phone] @ bedside about d/c planning. Pt is from  [facility] / memory care ,[phone]. Richard stated he would like for wife to d/c back ALF / memory care unit with hospice care.  NCM called and spoke with Danita/ Business office coordinator @ [facility] regarding TOC needs requested by husband. Danita stated they will be sending one of their nurses to assess pt to determine if they can manage pt @ facility. NCM will continue to monitor .Marland Kitchen.. awaiting assessment from ALF/Memory care. Gae Gallop RN, [place] [phone]

## 2019-06-11 NOTE — Plan of Care (Signed)
  Problem: Nutrition: Goal: Adequate nutrition will be maintained Outcome: Progressing   Problem: Safety: Goal: Ability to remain free from injury will improve Outcome: Progressing   

## 2019-06-11 NOTE — Progress Notes (Signed)
  Speech Language Pathology Treatment: Dysphagia  Patient Details Name: Sheri Lucas MRN: [id number] DOB: 1945-10-09 Today's Date: 06/11/2019 Time: 1347-1300 SLP Time Calculation (min) (ACUTE ONLY): 1393 min  Assessment / Plan / Recommendation Clinical Impression  Pt laying in bed, contracted, full lunch meal at bedside untouched. Provided total assist for head support and feeding, pt minimally responsive, does not sip from straw or purse lips to cup rim. She will orally manipulate and swallow boluses placed in mouth with total assist. Pt shows no interest in PO and after several minutes started grimacing. Expect PO intake to be poor as pt continues to be minimally responsive and really has to be force fed with increased risk of aspiration, which is not typically staff's role in feeding pts here. No further interventions expected to be beneficial at this time, will sign off.   HPI HPI: Pt is a 74 y.o. female with medical history significant of advanced dementia who was admitted because her husband went to visit her and she was very sedated and sleepy not responding as normal. At her baseline she is nonverbal mostly and nonambulatory bedbound.  Chest x-ray was negative for acute changes.       SLP Plan  Continue with current plan of care       Recommendations  Medication Administration: Crushed with puree Compensations: Small sips/bites;Slow rate;Follow solids with liquid                Oral Care Recommendations: Oral care BID;Staff/trained caregiver to provide oral care Follow up Recommendations: None SLP Visit Diagnosis: Dysphagia, oral phase (R13.11) Plan: Continue with current plan of care       GO               Sheri Ditty, MA CCC-SLP  Acute Rehabilitation Services Pager [phone] Office [phone]  Sheri Lucas 06/11/2019, 2:34 PM

## 2019-06-12 LAB — BASIC METABOLIC PANEL
Anion gap: 12 (ref 5–15)
BUN: 14 mg/dL (ref 8–23)
CO2: 26 mmol/L (ref 22–32)
Calcium: 8.4 mg/dL — ABNORMAL LOW (ref 8.9–10.3)
Chloride: 103 mmol/L (ref 98–111)
Creatinine, Ser: 0.49 mg/dL (ref 0.44–1.00)
GFR calc Af Amer: >60 mL/min (ref >60)
GFR calc non Af Amer: >60 mL/min (ref >60)
Glucose, Bld: 96 mg/dL (ref 70–99)
Potassium: 3.8 mmol/L (ref 3.5–5.1)
Sodium: 141 mmol/L (ref 135–145)

## 2019-06-12 LAB — CULTURE, BLOOD (ROUTINE X 2)
Culture: NO GROWTH
Culture: NO GROWTH
Special Requests: ADEQUATE
Special Requests: ADEQUATE

## 2019-06-12 MED ORDER — ALPRAZOLAM 0.25 MG PO TABS
0.2500 mg | ORAL_TABLET | Freq: Two times a day (BID) | ORAL | Status: DC
  Administered 2019-06-12 – 2019-06-13 (×2): 0.25 mg via ORAL
  Filled 2019-06-12 (×3): qty 1

## 2019-06-12 MED ORDER — SERTRALINE HCL 50 MG PO TABS
50.0000 mg | ORAL_TABLET | Freq: Every day | ORAL | Status: DC
  Administered 2019-06-13: 50 mg via ORAL
  Filled 2019-06-12 (×2): qty 1

## 2019-06-12 MED ORDER — DIVALPROEX SODIUM 125 MG PO DR TAB
125.0000 mg | DELAYED_RELEASE_TABLET | Freq: Two times a day (BID) | ORAL | Status: DC
  Administered 2019-06-12 – 2019-06-13 (×2): 125 mg via ORAL
  Filled 2019-06-12 (×4): qty 1

## 2019-06-12 MED ORDER — TRAZODONE HCL 50 MG PO TABS: 25.0000 mg | ORAL_TABLET | Freq: Every evening | ORAL | Status: DC | PRN

## 2019-06-12 NOTE — NC FL2 (Addendum)
MEDICAID FL2 LEVEL OF CARE SCREENING TOOL     IDENTIFICATION  Patient Name: ROCHELLE LARUE Birthdate: 03/07/1946 Sex: female Admission Date (Current Location): 06/07/2019  [facility] and [place] Number:  Producer, television/film/video and Address:  [facility], [address], [place], [place] [address]      Provider Number: [id number]  Attending Physician Name and Address:  Maretta Bees, MD  Relative Name and Phone Number:  CASEY MAXFIELD (Spouse) [phone]    Current Level of Care: Hospital Recommended Level of Care: Skilled Nursing Facility Prior Approval Number:    Date Approved/Denied:   PASRR Number:  [id number]  Discharge Plan: SNF    Current Diagnoses: Patient Active Problem List   Diagnosis Date Noted  . Dehydration 06/07/2019  . Fever 06/07/2019  . FTT (failure to thrive) in adult 06/07/2019  . Hypernatremia 06/07/2019  . Alzheimer's disease (HCC) 03/07/2017  . Memory deficits 01/13/2015    Orientation RESPIRATION BLADDER Height & Weight     Self  Normal Incontinent, External catheter Weight: 95 lb 14.4 oz (43.5 kg) Height:  5' (152.4 cm)  BEHAVIORAL SYMPTOMS/MOOD NEUROLOGICAL BOWEL NUTRITION STATUS  (no behavioral symptoms/mood)   Incontinent Diet(see dc summary)  AMBULATORY STATUS COMMUNICATION OF NEEDS Skin   Total Care Non-Verbally Bruising, Other (Comment)(bruising R and L arms)                       Personal Care Assistance Level of Assistance  Bathing, Feeding, Dressing Bathing Assistance: Maximum assistance Feeding assistance: Maximum assistance Dressing Assistance: Maximum assistance     Functional Limitations Info  Sight, Hearing, Speech Sight Info: Adequate Hearing Info: Adequate Speech Info: Impaired    SPECIAL CARE FACTORS FREQUENCY             Contractures Contractures Info: Not present    Additional Factors Info  Psychotropic Code Status Info: DNR Allergies Info: Amoxicillin,  Keflex, Penicillins Psychotropic Info: Xanax         Current Medications (06/12/2019):  This is the current hospital active medication list Current Facility-Administered Medications  Medication Dose Route Frequency Provider Last Rate Last Admin  . ALPRAZolam Prudy Feeler) tablet 0.25 mg  0.25 mg Oral BID Maretta Bees, MD      . divalproex (DEPAKOTE) DR tablet 125 mg  125 mg Oral BID Maretta Bees, MD      . donepezil (ARICEPT) tablet 10 mg  10 mg Oral QHS Maretta Bees, MD   10 mg at 06/11/19 2204  . enoxaparin (LOVENOX) injection 30 mg  30 mg Subcutaneous Q24H Tarry Kos A, MD   30 mg at 06/11/19 2204  . feeding supplement (ENSURE ENLIVE) (ENSURE ENLIVE) liquid 237 mL  237 mL Oral BID BM Maretta Bees, MD   237 mL at 06/11/19 1059  . LORazepam (ATIVAN) injection 1 mg  1 mg Intravenous Q4H PRN Haydee Monica, MD      . Melatonin TABS 3 mg  3 mg Oral QHS Maretta Bees, MD   3 mg at 06/11/19 2204  . memantine (NAMENDA) tablet 10 mg  10 mg Oral BID Maretta Bees, MD   10 mg at 06/11/19 2204  . multivitamin with minerals tablet 1 tablet  1 tablet Oral Daily Maretta Bees, MD   1 tablet at 06/11/19 1059  . ondansetron (ZOFRAN) tablet 4 mg  4 mg Oral Q6H PRN Haydee Monica, MD  Or  . ondansetron (ZOFRAN) injection 4 mg  4 mg Intravenous Q6H PRN Phillips Grout, MD      . Derrill Memo ON 06/13/2019] sertraline (ZOLOFT) tablet 50 mg  50 mg Oral Daily Ghimire, Henreitta Leber, MD      . traZODone (DESYREL) tablet 25 mg  25 mg Oral QHS PRN Ghimire, Henreitta Leber, MD         Discharge Medications: Please see discharge summary for a list of discharge medications.  Relevant Imaging Results:  Relevant Lab Results:   Additional Information SSN: [id number]            Add Hospice services at the SNF.  Benard Halsted, LCSW

## 2019-06-12 NOTE — Progress Notes (Signed)
PROGRESS NOTE        PATIENT DETAILS Name: Sheri Lucas Age: 74 y.o. Sex: female Date of Birth: Aug 17, 1945 Admit Date: 06/07/2019 Admitting Physician Phillips Grout, MD PJA:[id number], Adele Barthel, DO  Brief Narrative: Patient is a 74 y.o. female with history of advanced dementia brought in from assisted living facility for evaluation of worsening confusion and fever.  See below for further details.  Antimicrobial therapy: Aztreonam: 3/5>> Vancomycin: 3/5>>3/7 Flagyl: 3/5>>3/7  Microbiology data 3/5>>Blood cultures-negative  3/5>> influenza PCR negative 3/5>> Covid negative.  Subjective: More lethargic today-hardly any significant oral intake for the past few days.  Assessment/Plan: Systemic inflammatory response syndrome: SIRS physiology has resolved-blood cultures negative.  UA/chest x-ray/Covid/influenza studies negative.  Stop aztreonam on 3/10.  Hypernatremia: Secondary to dehydration-IV fluids.  All IV fluids were discontinued on 3/8-sodium levels remain normal.  Continue to encourage oral intake-unfortunately very minimal oral intake over the past few days per nursing staff.  Hypokalemia: Repleted.  Acute metabolic encephalopathy superimposed on advanced dementia: Encephalopathy likely secondary to hypernatremia and fever/infection.  However more lethargic encephalopathic today-not sure if these are from her medications-have decreased dosage of Zoloft, Depakote-changed Xanax to twice daily.  Continue supportive care.  Thrombocytopenia: Likely secondary to infection/SIRS.  Improving-follow closely-on Lovenox at prophylactic dose.  Dementia: Appears to be advanced-resume Aricept/Namenda/Depakote-Zoloft and trazodone  Failure to thrive syndrome: Mostly lethargic-sleeps the whole day.  Very poor oral intake over the past few days.  Medications adjusted-see above.  Due to poor oral intake-lethargy-poor functional status-overall prognosis is very poor.   See disposition plans below.  Goals of care: DNR in place.  Have had numerous discussions with the patient's spouse on a daily basis since admission.  Overall goals of care are for gentle medical treatment.  Family does not desire aggressive or ICU level of care.  Although improved than on presentation-continues to have severe failure to thrive syndrome with very little/insignificant oral intake-and continues to have significant issues with lethargy/frailty.  Discussion again with patient spouse-he is aware that if patient deteriorates significantly-no other options apart from transitioning to comfort measures.  Per case management-ALF not able to take patient back-have asked case management to see if family wants to try SNF with hospice care or residential hospice.  Diet: Diet Order            DIET - DYS 1 Room service appropriate? No; Fluid consistency: Thin  Diet effective now               DVT Prophylaxis: Prophylactic Lovenox   Code Status:  DNR  Family Communication: Spouse over the phone on 3/10  Disposition Plan: SNF with hospice follow-up or residential hospice.  ALF not able to take patient back for case management.  Barriers to Discharge: Pending SNF bed or residential hospice bed-continues to be encephalopathic.  Antimicrobial agents: Anti-infectives (From admission, onward)   Start     Dose/Rate Route Frequency Ordered Stop   06/08/19 2100  vancomycin (VANCOREADY) IVPB 500 mg/100 mL  Status:  Discontinued     500 mg 100 mL/hr over 60 Minutes Intravenous Every 24 hours 06/07/19 1946 06/09/19 1109   06/08/19 0500  metroNIDAZOLE (FLAGYL) IVPB 500 mg  Status:  Discontinued     500 mg 100 mL/hr over 60 Minutes Intravenous Every 8 hours 06/07/19 2100 06/09/19 1109   06/07/19 2000  vancomycin (  VANCOREADY) IVPB 750 mg/150 mL     750 mg 150 mL/hr over 60 Minutes Intravenous  Once 06/07/19 1944 06/07/19 2308   06/07/19 2000  aztreonam (AZACTAM) 1 g in sodium chloride 0.9 %  100 mL IVPB  Status:  Discontinued     1 g 200 mL/hr over 30 Minutes Intravenous Every 8 hours 06/07/19 1944 06/12/19 1350   06/07/19 1930  aztreonam (AZACTAM) 2 g in sodium chloride 0.9 % 100 mL IVPB  Status:  Discontinued     2 g 200 mL/hr over 30 Minutes Intravenous  Once 06/07/19 1928 06/07/19 1943   06/07/19 1930  metroNIDAZOLE (FLAGYL) IVPB 500 mg     500 mg 100 mL/hr over 60 Minutes Intravenous  Once 06/07/19 1928 06/07/19 2155   06/07/19 1930  vancomycin (VANCOCIN) IVPB 1000 mg/200 mL premix  Status:  Discontinued     1,000 mg 200 mL/hr over 60 Minutes Intravenous  Once 06/07/19 1928 06/07/19 1943      Procedures: None  CONSULTS:  None  Time spent: 25 minutes-Greater than 50% of this time was spent in counseling, explanation of diagnosis, planning of further management, and coordination of care.  MEDICATIONS: Scheduled Meds: . ALPRAZolam  0.25 mg Oral BID  . divalproex  125 mg Oral BID  . donepezil  10 mg Oral QHS  . enoxaparin (LOVENOX) injection  30 mg Subcutaneous Q24H  . feeding supplement (ENSURE ENLIVE)  237 mL Oral BID BM  . Melatonin  3 mg Oral QHS  . memantine  10 mg Oral BID  . multivitamin with minerals  1 tablet Oral Daily  . [START ON 06/13/2019] sertraline  50 mg Oral Daily   Continuous Infusions:  PRN Meds:.LORazepam, ondansetron **OR** ondansetron (ZOFRAN) IV, traZODone   PHYSICAL EXAM: Vital signs: Vitals:   06/11/19 1503 06/11/19 2018 06/12/19 0344 06/12/19 0744  BP: 124/69 123/62 115/68 127/64  Pulse: 86 76 93 82  Resp: 16 16 16 16   Temp: (!) 97.5 F (36.4 C) 98.3 F (36.8 C) 98.7 F (37.1 C) 98.2 F (36.8 C)  TempSrc: Oral Oral Oral Oral  SpO2: 96% 99% 94% 95%  Weight:   43.5 kg   Height:       Filed Weights   06/10/19 0500 06/11/19 0500 06/12/19 0344  Weight: 39.2 kg 39 kg 43.5 kg   Body mass index is 18.73 kg/m.   Gen Exam: Lethargic when seen earlier-mumble a few words. HEENT:atraumatic, normocephalic Chest: B/L clear to  auscultation anteriorly CVS:S1S2 regular Abdomen:soft non tender, non distended Extremities:no edema Neurology: Difficult exam-has global weakness but nonfocal. Skin: no rash  I have personally reviewed following labs and imaging studies  LABORATORY DATA:  CBC: Recent Labs  Lab 06/07/19 1710 06/07/19 1710 06/07/19 2218 06/08/19 0554 06/09/19 0347 06/10/19 0219 06/11/19 0427  WBC 19.5*   < > 16.6* 14.1* 14.1* 11.8* 10.5  NEUTROABS 15.8*  --   --   --   --   --   --   HGB 16.2*   < > 13.5 12.8 11.4* 12.1 12.5  HCT 53.1*   < > 45.9 41.5 37.2 38.1 38.6  MCV 92.5   < > 94.4 92.6 92.3 89.2 87.3  PLT 187   < > 142* 98* 116* 127* 190   < > = values in this interval not displayed.    Basic Metabolic Panel: Recent Labs  Lab 06/08/19 0554 06/09/19 0347 06/10/19 0219 06/11/19 0427 06/12/19 0326  NA 155* 150* 144 140 141  K  3.0* 2.9* 3.2* 3.7 3.8  CL 121* 114* 112* 106 103  CO2 22 28 25 27 26   GLUCOSE 176* 95 100* 107* 96  BUN 31* 18 12 8 14   CREATININE 0.50 0.64 0.39* 0.40* 0.49  CALCIUM 7.8* 7.7* 7.7* 8.1* 8.4*  MG  --  2.1  --  2.2  --     GFR: Estimated Creatinine Clearance: 43.7 mL/min (by C-G formula based on SCr of 0.49 mg/dL).  Liver Function Tests: Recent Labs  Lab 06/07/19 1710 06/09/19 0347  AST 26 17  ALT 21 14  ALKPHOS 58 41  BILITOT 0.9 0.3  PROT 7.1 4.5*  ALBUMIN 3.3* 2.0*   No results for input(s): LIPASE, AMYLASE in the last 168 hours. No results for input(s): AMMONIA in the last 168 hours.  Coagulation Profile: No results for input(s): INR, PROTIME in the last 168 hours.  Cardiac Enzymes: No results for input(s): CKTOTAL, CKMB, CKMBINDEX, TROPONINI in the last 168 hours.  BNP (last 3 results) No results for input(s): PROBNP in the last 8760 hours.  Lipid Profile: No results for input(s): CHOL, HDL, LDLCALC, TRIG, CHOLHDL, LDLDIRECT in the last 72 hours.  Thyroid Function Tests: No results for input(s): TSH, T4TOTAL, FREET4, T3FREE,  THYROIDAB in the last 72 hours.  Anemia Panel: No results for input(s): VITAMINB12, FOLATE, FERRITIN, TIBC, IRON, RETICCTPCT in the last 72 hours.  Urine analysis:    Component Value Date/Time   COLORURINE YELLOW 06/08/2019 0029   APPEARANCEUR CLEAR 06/08/2019 0029   LABSPEC 1.032 (H) 06/08/2019 0029   PHURINE 5.0 06/08/2019 0029   GLUCOSEU NEGATIVE 06/08/2019 0029   HGBUR NEGATIVE 06/08/2019 0029   BILIRUBINUR NEGATIVE 06/08/2019 0029   KETONESUR NEGATIVE 06/08/2019 0029   PROTEINUR 30 (A) 06/08/2019 0029   NITRITE NEGATIVE 06/08/2019 0029   LEUKOCYTESUR NEGATIVE 06/08/2019 0029    Sepsis Labs: Lactic Acid, Venous    Component Value Date/Time   LATICACIDVEN 1.7 06/07/2019 1715    MICROBIOLOGY: Recent Results (from the past 240 hour(s))  Culture, blood (Routine X 2) w Reflex to ID Panel     Status: None   Collection Time: 06/07/19  5:00 PM   Specimen: BLOOD RIGHT ARM  Result Value Ref Range Status   Specimen Description BLOOD RIGHT ARM  Final   Special Requests   Final    BOTTLES DRAWN AEROBIC AND ANAEROBIC Blood Culture adequate volume   Culture   Final    NO GROWTH 5 DAYS Performed at [facility], [address] [place], [address] [place]    Report Status 06/12/2019 FINAL  Final  Culture, blood (Routine X 2) w Reflex to ID Panel     Status: None   Collection Time: 06/07/19  5:05 PM   Specimen: BLOOD RIGHT ARM  Result Value Ref Range Status   Specimen Description BLOOD RIGHT ARM  Final   Special Requests   Final    BOTTLES DRAWN AEROBIC AND ANAEROBIC Blood Culture adequate volume   Culture   Final    NO GROWTH 5 DAYS Performed at [facility], [address], [place], [address] [place]    Report Status 06/12/2019 FINAL  Final  SARS CORONAVIRUS 2 (TAT 6-24 HRS) Nasopharyngeal Nasopharyngeal Swab     Status: None   Collection Time: 06/07/19  8:39 PM   Specimen: Nasopharyngeal Swab  Result Value Ref Range Status   SARS Coronavirus 2 NEGATIVE  NEGATIVE Final    Comment: (NOTE) SARS-CoV-2 target nucleic acids are NOT  DETECTED. The SARS-CoV-2 RNA is generally detectable in upper and lower respiratory specimens during the acute phase of infection. Negative results do not preclude SARS-CoV-2 infection, do not rule out co-infections with other pathogens, and should not be used as the sole basis for treatment or other patient management decisions. Negative results must be combined with clinical observations, patient history, and epidemiological information. The expected result is Negative. Fact Sheet for Patients: HairSlick.no Fact Sheet for Healthcare Providers: quierodirigir.com This test is not yet approved or cleared by the [place] FDA and  has been authorized for detection and/or diagnosis of SARS-CoV-2 by FDA under an Emergency Use Authorization (EUA). This EUA will remain  in effect (meaning this test can be used) for the duration of the COVID-19 declaration under Section 56 4(b)(1) of the Act, 21 U.S.C. section 360bbb-3(b)(1), unless the authorization is terminated or revoked sooner. Performed at [facility], [address] [place], [place] [address]     RADIOLOGY STUDIES/RESULTS: No results found.   LOS: 5 days   Jeoffrey Massed, MD  [facility]    To contact the attending provider between 7A-7P or the covering provider during after hours 7P-7A, please log into the web site [email or web address] and access using universal [place] password for that web site. If you do not have the password, please call the hospital operator.  06/12/2019, 2:04 PM

## 2019-06-12 NOTE — Plan of Care (Addendum)
Pt AMS, unable to create pt password.   Pt unable to fully arouse. VS and labs WNL, pt will grimace but not open her eyes. No meds able to be given this AM. Notified Ghimire, MD who stated she is likely on her way to hospice and instructed that Rn contact case manager. CM RN made aware.   Pt unable to make password for phone call updates d/t dementia.   Problem: Education: Goal: Knowledge of General Education information will improve Description: Including pain rating scale, medication(s)/side effects and non-pharmacologic comfort measures Outcome: Progressing   Problem: Health Behavior/Discharge Planning: Goal: Ability to manage health-related needs will improve Outcome: Progressing   Problem: Activity: Goal: Risk for activity intolerance will decrease Outcome: Progressing   Problem: Nutrition: Goal: Adequate nutrition will be maintained Outcome: Progressing   Problem: Safety: Goal: Ability to remain free from injury will improve Outcome: Progressing   Problem: Skin Integrity: Goal: Risk for impaired skin integrity will decrease Outcome: Progressing

## 2019-06-12 NOTE — Progress Notes (Addendum)
NCM received call from [facility]. Elizabeth informed NCM pt would not be able to return, unable to provide level care needed. NCM made pt's husband aware. Husband agreeable to SNF placement with hospice care. Husband with preference for hospice care.Referral made with Civil engineer, contracting. TOC to work pt up for SNF placement with hospice care. Gae Gallop RN,BSN,CM [phone]

## 2019-06-12 NOTE — Progress Notes (Signed)
Patent examiner [facility]) Hospital Liaison: RN note    Notified by Transition of Care Manger Gae Gallop RN of patient/family request for [facility] services at [facility] after discharge. Chart and patient information under review by [facility] physician. Hospice eligibility pending currently.     Write spoke with husband to explain services and offer support.   Liaison will follow up 06/13/2019 to verify SNF placement and assist with initiation of hospice services.   Please call with any hospice related questions.     Thank you for this referral.      Elsie Saas, RN, [facility] (listed on AMION under [facility] [place])   [phone]

## 2019-06-12 NOTE — Progress Notes (Signed)
Nutrition Follow-up  DOCUMENTATION CODES:   Non-severe (moderate) malnutrition in context of chronic illness  INTERVENTION:   -Continue Ensure Enlive po BID, each supplement provides 350 kcal and 20 grams of protein -Continue MVI with minerals daily -Continue Magic cup TID with meals, each supplement provides 290 kcal and 9 grams of protein  NUTRITION DIAGNOSIS:   Moderate Malnutrition related to chronic illness(dementia) as evidenced by mild fat depletion, moderate fat depletion, mild muscle depletion, moderate muscle depletion, percent weight loss.  Ongoing  GOAL:   Patient will meet greater than or equal to 90% of their needs  Progressing   MONITOR:   PO intake, Supplement acceptance, Diet advancement, Labs, Weight trends, Skin, I & O's  REASON FOR ASSESSMENT:   Low Braden    ASSESSMENT:   Sheri Lucas is a 74 y.o. female with medical history significant of advanced dementia brought in today because her husband went to visit her today and she was very sedated and sleepy not responding as normal.  At her baseline she is nonverbal mostly and nonambulatory bedbound.  They have adjusted her medications recently because she was screaming a lot was unclear whether or not she was in pain or was just scared.  Since they have adjusted her medications she has been sleeping a lot and not eating and drinking very well.  Husband reports no nausea vomiting or diarrhea.  Husband is at baseline.  Husband reports she has been deteriorating for months.  They are in the process of switching to hospice at the nursing home.  Patient is DNR.  Patient was noted to have a fever of 101 and sent to the emergency department.  Chest x-ray is negative.  Urinalysis is pending.  Patient is very dehydrated.  Patient be referred for admission for dehydration.  3/8- s/p BSE- advanced to dysphagia 1 diet with thin liquids  Reviewed I/O's: -240 ml x 24 hours and +3.9 L since admission  UOP: 300 ml x 24  hours  Pt resting quietly at time of visit. She did not respond to touch or RD voice. Noted meal completion 0-10%. Per SLP and chart review, pt eating very little.   Reviewed wt hx; noted pt has experienced a 16% wt loss over the past 8 months, which is significant for time frame.  Per RNCM notes, plan to d/c back to ALF with hospice services per discussion with pt husband.   Labs reviewed.   NUTRITION - FOCUSED PHYSICAL EXAM:    Most Recent Value  Orbital Region  Moderate depletion  Upper Arm Region  Mild depletion  Thoracic and Lumbar Region  Mild depletion  Buccal Region  Mild depletion  Temple Region  Mild depletion  Clavicle Bone Region  Moderate depletion  Clavicle and Acromion Bone Region  Moderate depletion  Scapular Bone Region  Moderate depletion  Dorsal Hand  Mild depletion  Patellar Region  Moderate depletion  Anterior Thigh Region  Moderate depletion  Posterior Calf Region  Moderate depletion  Edema (RD Assessment)  Mild  Hair  Reviewed  Eyes  Reviewed  Mouth  Reviewed  Skin  Reviewed  Nails  Reviewed       Diet Order:   Diet Order            DIET - DYS 1 Room service appropriate? No; Fluid consistency: Thin  Diet effective now              EDUCATION NEEDS:   No education needs have been identified at  this time  Skin:  Skin Assessment: Reviewed RN Assessment  Last BM:  06/09/19  Height:   Ht Readings from Last 1 Encounters:  06/07/19 5' (1.524 m)    Weight:   Wt Readings from Last 1 Encounters:  06/12/19 43.5 kg    Ideal Body Weight:  45.5 kg  BMI:  Body mass index is 18.73 kg/m.  Estimated Nutritional Needs:   Kcal:  1400-1600  Protein:  65-80 grams  Fluid:  > 1.4 L    Loistine Chance, RD, LDN, [place] Registered Dietitian II Certified Diabetes Care and Education Specialist Please refer to [facility] for RD and/or RD on-call/weekend/after hours pager

## 2019-06-12 NOTE — Progress Notes (Signed)
Clinicals faxed @ [phone] to  Ulyses Jarred RN per request to assess if they could mange pt's care. Elizabeth to f/u with NCM. Gae Gallop RN,BSN,CM [phone]

## 2019-06-13 DIAGNOSIS — F0280 Dementia in other diseases classified elsewhere without behavioral disturbance: Secondary | ICD-10-CM

## 2019-06-13 DIAGNOSIS — R627 Adult failure to thrive: Secondary | ICD-10-CM

## 2019-06-13 DIAGNOSIS — G300 Alzheimer's disease with early onset: Secondary | ICD-10-CM

## 2019-06-13 LAB — BASIC METABOLIC PANEL
Anion gap: 12 (ref 5–15)
BUN: 18 mg/dL (ref 8–23)
CO2: 25 mmol/L (ref 22–32)
Calcium: 8.1 mg/dL — ABNORMAL LOW (ref 8.9–10.3)
Chloride: 103 mmol/L (ref 98–111)
Creatinine, Ser: 0.61 mg/dL (ref 0.44–1.00)
GFR calc Af Amer: >60 mL/min (ref >60)
GFR calc non Af Amer: >60 mL/min (ref >60)
Glucose, Bld: 104 mg/dL — ABNORMAL HIGH (ref 70–99)
Potassium: 3.7 mmol/L (ref 3.5–5.1)
Sodium: 140 mmol/L (ref 135–145)

## 2019-06-13 MED ORDER — LORAZEPAM 2 MG/ML PO CONC: 0.6000 mg | Freq: Four times a day (QID) | ORAL | 0 refills | Status: AC | PRN

## 2019-06-13 MED ORDER — MORPHINE SULFATE (CONCENTRATE) 20 MG/ML PO SOLN: 5.0000 mg | ORAL | 0 refills | Status: AC | PRN

## 2019-06-13 NOTE — Discharge Summary (Signed)
PATIENT DETAILS Name: Sheri Lucas Age: 74 y.o. Sex: female Date of Birth: [birth year] MRN: [id number]. Admitting Physician: Haydee Monicaachal A David, MD ATF:[id number]:Masneri, Wille CelesteShannon M, DO  Admit Date: 06/07/2019 Discharge date: 06/13/2019  Recommendations for Outpatient Follow-up:  1. Optimize comfort measures.  Admitted From:  ALF   Disposition: Residential Hospice     Home Health: No  Equipment/Devices: None  Discharge Condition: Guarded/hospice)  CODE STATUS: DNR  Diet recommendation:  Diet Order            Diet - low sodium heart healthy        DIET - DYS 1 Room service appropriate? No; Fluid consistency: Thin  Diet effective now               Brief Narrative: Patient is a 74 y.o. female with history of advanced dementia brought in from assisted living facility for evaluation of worsening confusion and fever.    Per spouse-patient has had significant decrease in her level of functioning-and has been gradually declining over the past several months. See below for further details.  Antimicrobial therapy: Aztreonam: 3/5>>3/10 Vancomycin: 3/5>>3/7 Flagyl: 3/5>>3/7  Microbiology data 3/5>>Blood cultures-negative  3/5>> influenza PCR negative 3/5>> Covid negative.   Brief Hospital Course: Systemic inflammatory response syndrome: SIRS physiology has resolved-blood cultures negative.  UA/chest x-ray/Covid/influenza studies negative.    No longer on IV antibiotics.  Unfortunately patient's overall condition remains very poor-still very lethargic and with no oral intake.  Hypernatremia: Secondary to dehydration-IV fluids.  All IV fluids were discontinued on 3/8-sodium levels remain normal.  Patient's oral intake is very poor to minimal-suspect that sodium levels will increase again in the future.  Hypokalemia: Repleted.  Acute metabolic encephalopathy superimposed on advanced dementia: Encephalopathy likely secondary to hypernatremia and fever/infection.    However  in spite of correcting her electrolyte and SIRS physiology-patient remains very lethargic-with very minimal oral intake.  Doses of her regular medications-Zoloft, Depakote and Xanax were decreased-in spite of these changes-her lethargy has not improved.  I suspect she may have terminal delirium/lethargy from dementia.  After extensive discussion with family-she is being transitioned to residential hospice.    Thrombocytopenia: Likely secondary to infection/SIRS.  Resolved with supportive care  Dementia: Appears to be advanced-now with perhaps terminal delirium/lethargy.  No indication to resume Aricept/Namenda/Depakote on Zoloft on discharge.  Will focus mostly on comfort.    Failure to thrive syndrome: Very lethargic-minimal oral intake-drooling at times.  See above.    Goals of care: DNR in place.  I had multiple discussions with the patient's spouse on a daily basis-in spite of correcting electrolyte of normalities and improvement in her fever-patient remains very lethargic with very poor oral intake.  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  Case management has discussed with family as well-currently recommendations are to transition to residential hospice on discharge.  Family is agreeable.     Nutrition Problem: Nutrition Problem: Moderate Malnutrition Etiology: chronic illness(dementia) Signs/Symptoms: mild fat depletion, moderate fat depletion, mild muscle depletion, moderate muscle depletion, percent weight loss Percent weight loss: 16 % Interventions: Ensure Enlive (each supplement provides 350kcal and 20 grams of protein), MVI, Magic cup   Procedures None  Discharge Diagnoses:  Principal Problem:   Dehydration Active Problems:   Alzheimer's disease (HCC)   Fever   FTT (failure to thrive) in adult   Hypernatremia   Discharge Instructions:  Activity:  As tolerated with Full fall precautions use walker/cane & assistance as  needed   Discharge  Instructions    Diet - low sodium heart healthy   Complete by: As directed    Increase activity slowly   Complete by: As directed      Allergies as of 06/13/2019      Reactions   Amoxicillin Other (See Comments)   Unknown reaction Did it involve swelling of the face/tongue/throat, SOB, or low BP? Unknown Did it involve sudden or severe rash/hives, skin peeling, or any reaction on the inside of your mouth or nose? Unknown Did you need to seek medical attention at a hospital or doctor's office? Unknown When did it last happen?unknown - more than 52 yrs ago per spouse If all above answers are "NO", may proceed with cephalosporin use.   Keflex [cephalexin] Other (See Comments)   Unknown reaction   Penicillins Other (See Comments)   Unknown reaction (per MAR)Did it involve swelling of the face/tongue/throat, SOB, or low BP? Unknown Did it involve sudden or severe rash/hives, skin peeling, or any reaction on the inside of your mouth or nose? Unknown Did you need to seek medical attention at a hospital or doctor's office? Unknown When did it last happen?unknown - more than 52 yrs ago per spouse If all above answers are "NO", may proceed with cephalosporin use.      Medication List    STOP taking these medications   acetaminophen 500 MG tablet Commonly known as: TYLENOL   ALPRAZolam 0.25 MG tablet Commonly known as: XANAX   divalproex 250 MG DR tablet Commonly known as: DEPAKOTE   donepezil 10 MG tablet Commonly known as: ARICEPT   Melatonin 5 MG Tabs   memantine 10 MG tablet Commonly known as: NAMENDA   sertraline 100 MG tablet Commonly known as: ZOLOFT   traZODone 50 MG tablet Commonly known as: DESYREL     TAKE these medications   LORazepam 2 MG/ML concentrated solution Commonly known as: LORazepam Intensol Take 0.3 mLs (0.6 mg total) by mouth every 6 (six) hours as needed for anxiety, seizure or sedation.   morphine 20 MG/ML  concentrated solution Commonly known as: ROXANOL Take 0.25 mLs (5 mg total) by mouth every 2 (two) hours as needed for severe pain or anxiety.       Allergies  Allergen Reactions  . Amoxicillin Other (See Comments)    Unknown reaction Did it involve swelling of the face/tongue/throat, SOB, or low BP? Unknown Did it involve sudden or severe rash/hives, skin peeling, or any reaction on the inside of your mouth or nose? Unknown Did you need to seek medical attention at a hospital or doctor's office? Unknown When did it last happen?unknown - more than 52 yrs ago per spouse If all above answers are "NO", may proceed with cephalosporin use.  Marland Kitchen Keflex [Cephalexin] Other (See Comments)    Unknown reaction  . Penicillins Other (See Comments)    Unknown reaction (per MAR)Did it involve swelling of the face/tongue/throat, SOB, or low BP? Unknown Did it involve sudden or severe rash/hives, skin peeling, or any reaction on the inside of your mouth or nose? Unknown Did you need to seek medical attention at a hospital or doctor's office? Unknown When did it last happen?unknown - more than 52 yrs ago per spouse If all above answers are "NO", may proceed with cephalosporin use.     Consultations:   None  Other Procedures/Studies: DG Chest Port 1 View  Result Date: 06/07/2019 CLINICAL DATA:  Cough, altered mental status EXAM: PORTABLE CHEST 1 VIEW COMPARISON:  None. FINDINGS:  No consolidation, features of edema, pneumothorax, or effusion. The cardiomediastinal contours are unremarkable. No acute osseous or soft tissue abnormality. Degenerative changes are present in the imaged spine and shoulders. Telemetry leads overlie the chest. IMPRESSION: No acute cardiopulmonary disease. Electronically Signed   By: Kreg Shropshire M.D.   On: 06/07/2019 17:20     TODAY-DAY OF DISCHARGE:  Subjective:   Sheri Lucas today intubated lethargic-just opens eyes-but really does not follow  commands.  Objective:   Blood pressure 102/60, pulse 78, temperature 97.6 F (36.4 C), temperature source Oral, resp. rate 16, height 5' (1.524 m), weight 43.2 kg, SpO2 96 %.  Intake/Output Summary (Last 24 hours) at 06/13/2019 1217 Last data filed at 06/13/2019 0900 Gross per 24 hour  Intake 0 ml  Output 300 ml  Net -300 ml   Filed Weights   06/11/19 0500 06/12/19 0344 06/13/19 0500  Weight: 39 kg 43.5 kg 43.2 kg    Exam: Lethargic-barely arousable.     PERTINENT RADIOLOGIC STUDIES: No results found.   PERTINENT LAB RESULTS: CBC: Recent Labs    06/11/19 0427  WBC 10.5  HGB 12.5  HCT 38.6  PLT 190   CMET CMP     Component Value Date/Time   NA 140 06/13/2019 0831   K 3.7 06/13/2019 0831   CL 103 06/13/2019 0831   CO2 25 06/13/2019 0831   GLUCOSE 104 (H) 06/13/2019 0831   BUN 18 06/13/2019 0831   CREATININE 0.61 06/13/2019 0831   CALCIUM 8.1 (L) 06/13/2019 0831   PROT 4.5 (L) 06/09/2019 0347   ALBUMIN 2.0 (L) 06/09/2019 0347   AST 17 06/09/2019 0347   ALT 14 06/09/2019 0347   ALKPHOS 41 06/09/2019 0347   BILITOT 0.3 06/09/2019 0347   GFRNONAA >60 06/13/2019 0831   GFRAA >60 06/13/2019 0831    GFR Estimated Creatinine Clearance: 43.4 mL/min (by C-G formula based on SCr of 0.61 mg/dL). No results for input(s): LIPASE, AMYLASE in the last 72 hours. No results for input(s): CKTOTAL, CKMB, CKMBINDEX, TROPONINI in the last 72 hours. Invalid input(s): POCBNP No results for input(s): DDIMER in the last 72 hours. No results for input(s): HGBA1C in the last 72 hours. No results for input(s): CHOL, HDL, LDLCALC, TRIG, CHOLHDL, LDLDIRECT in the last 72 hours. No results for input(s): TSH, T4TOTAL, T3FREE, THYROIDAB in the last 72 hours.  Invalid input(s): FREET3 No results for input(s): VITAMINB12, FOLATE, FERRITIN, TIBC, IRON, RETICCTPCT in the last 72 hours. Coags: No results for input(s): INR in the last 72 hours.  Invalid input(s):  PT Microbiology: Recent Results (from the past 240 hour(s))  Culture, blood (Routine X 2) w Reflex to ID Panel     Status: None   Collection Time: 06/07/19  5:00 PM   Specimen: BLOOD RIGHT ARM  Result Value Ref Range Status   Specimen Description BLOOD RIGHT ARM  Final   Special Requests   Final    BOTTLES DRAWN AEROBIC AND ANAEROBIC Blood Culture adequate volume   Culture   Final    NO GROWTH 5 DAYS Performed at [facility], [address] [place], [place] [address]    Report Status 06/12/2019 FINAL  Final  Culture, blood (Routine X 2) w Reflex to ID Panel     Status: None   Collection Time: 06/07/19  5:05 PM   Specimen: BLOOD RIGHT ARM  Result Value Ref Range Status   Specimen Description BLOOD RIGHT ARM  Final   Special Requests   Final  BOTTLES DRAWN AEROBIC AND ANAEROBIC Blood Culture adequate volume   Culture   Final    NO GROWTH 5 DAYS Performed at [facility], [place] [address], [place], [place] [address]    Report Status 06/12/2019 FINAL  Final  SARS CORONAVIRUS 2 (TAT 6-24 HRS) Nasopharyngeal Nasopharyngeal Swab     Status: None   Collection Time: 06/07/19  8:39 PM   Specimen: Nasopharyngeal Swab  Result Value Ref Range Status   SARS Coronavirus 2 NEGATIVE NEGATIVE Final    Comment: (NOTE) SARS-CoV-2 target nucleic acids are NOT DETECTED. The SARS-CoV-2 RNA is generally detectable in upper and lower respiratory specimens during the acute phase of infection. Negative results do not preclude SARS-CoV-2 infection, do not rule out co-infections with other pathogens, and should not be used as the sole basis for treatment or other patient management decisions. Negative results must be combined with clinical observations, patient history, and epidemiological information. The expected result is Negative. Fact Sheet for Patients: SugarRoll.be Fact Sheet for Healthcare Providers: https://www.woods-mathews.com/ This  test is not yet approved or cleared by the [place] FDA and  has been authorized for detection and/or diagnosis of SARS-CoV-2 by FDA under an Emergency Use Authorization (EUA). This EUA will remain  in effect (meaning this test can be used) for the duration of the COVID-19 declaration under Section 56 4(b)(1) of the Act, 21 U.S.C. section 360bbb-3(b)(1), unless the authorization is terminated or revoked sooner. Performed at [facility], [place] [address], [place], [place] [address]     FURTHER DISCHARGE INSTRUCTIONS:  Get Medicines reviewed and adjusted: Please take all your medications with you for your next visit with your Primary MD  Laboratory/radiological data: Please request your Primary MD to go over all hospital tests and procedure/radiological results at the follow up, please ask your Primary MD to get all Hospital records sent to his/her office.  In some cases, they will be blood work, cultures and biopsy results pending at the time of your discharge. Please request that your primary care M.D. goes through all the records of your hospital data and follows up on these results.  Also Note the following: If you experience worsening of your admission symptoms, develop shortness of breath, life threatening emergency, suicidal or homicidal thoughts you must seek medical attention immediately by calling 911 or calling your MD immediately  if symptoms less severe.  You must read complete instructions/literature along with all the possible adverse reactions/side effects for all the Medicines you take and that have been prescribed to you. Take any new Medicines after you have completely understood and accpet all the possible adverse reactions/side effects.   Do not drive when taking Pain medications or sleeping medications (Benzodaizepines)  Do not take more than prescribed Pain, Sleep and Anxiety Medications. It is not advisable to combine anxiety,sleep and pain medications  without talking with your primary care practitioner  Special Instructions: If you have smoked or chewed Tobacco  in the last 2 yrs please stop smoking, stop any regular Alcohol  and or any Recreational drug use.  Wear Seat belts while driving.  Please note: You were cared for by a hospitalist during your hospital stay. Once you are discharged, your primary care physician will handle any further medical issues. Please note that NO REFILLS for any discharge medications will be authorized once you are discharged, as it is imperative that you return to your primary care physician (or establish a relationship with a primary care physician if you  do not have one) for your post hospital discharge needs so that they can reassess your need for medications and monitor your lab values.  Total Time spent coordinating discharge including counseling, education and face to face time equals 35 minutes.  SignedJeoffrey Massed 06/13/2019 12:17 PM

## 2019-06-13 NOTE — TOC Transition Note (Signed)
Transition of Care [facility]) - CM/SW Discharge Note   Patient Details  Name: COLLETTA SPILLERS MRN: [id number] Date of Birth: 1945/12/01  [facility]) CM/SW Contact:  Epifanio Lesches, RN Phone Number: [phone] 06/13/2019, 12:02 PM   Clinical Narrative:     Patient will DC to: [facility] Anticipated DC date: 06/13/2019 Family notified: Gerlene Burdock ( husband) Transport by: Sharin Mons   Per MD patient ready for DC to [facility] . RN, patient, patient's husband /Richard aware of d/c plan.  RN to call report prior to discharge [phone]). DC packet on chart. Ambulance transport requested for patient.   RNCM will sign off for now as intervention is no longer needed. Please consult [place] again if new needs arise.     Pt will transition to residential hospice / [facility] today.   Final next level of care: Hospice Medical Facility Barriers to Discharge: No Barriers Identified   Patient Goals and CMS Choice        Discharge Placement     Discharge Plan and Services                                     Social Determinants of Health (SDOH) Interventions     Readmission Risk Interventions No flowsheet data found.

## 2019-06-13 NOTE — Progress Notes (Signed)
Still very lethargic in spite of adjusting medications yesterday.  Hardly any oral intake.  And drooling.  At this point I suspect patient has end-stage/terminal delirium/dementia with severe failure to thrive syndrome.  I suspect she is appropriate for residential hospice.

## 2019-06-13 NOTE — Plan of Care (Signed)
  Problem: Safety: Goal: Ability to remain free from injury will improve Outcome: Progressing   Problem: Skin Integrity: Goal: Risk for impaired skin integrity will decrease Outcome: Progressing   

## 2019-06-13 NOTE — Progress Notes (Signed)
NCM @ bedside with MD assessing pt . MD recommending residential hospice instead of SNF with hospice care. NCM called and made husband Gerlene Burdock [phone] aware. Husband stated will arrive on unit at 10:00 am and would like to further discuss process for residential hospice. NCM will f/u with husband once he has arrived on floor. NCM will also make [facility] of change. Gae Gallop RN,BSN,CM [phone]

## 2019-06-13 NOTE — Progress Notes (Signed)
Manufacturing systems engineer [facility]) Hospital Liaison note.    Received request from [facility] manager Gae Gallop, RN for family interest in [facility]. Chart reviewed and eligibility confirmed. Spoke with husband, Gerlene Burdock to confirm interest and explain services. Family agreeable to transfer today. TOC manager aware.   Will notify [facility] manager when paperwork is completed to arrange transfer.     RN please call report to [phone].   Thank you,     Elsie Saas, RN, CCM      [facility] Liaison (listed on AM[facility] under [facility] [place])    [phone]

## 2019-06-13 NOTE — Plan of Care (Signed)
  Problem: Education: Goal: Knowledge of General Education information will improve Description: Including pain rating scale, medication(s)/side effects and non-pharmacologic comfort measures Outcome: Not Progressing   Problem: Activity: Goal: Risk for activity intolerance will decrease Outcome: Not Progressing   Problem: Safety: Goal: Ability to remain free from injury will improve Outcome: Progressing   Problem: Skin Integrity: Goal: Risk for impaired skin integrity will decrease Outcome: Progressing

## 2019-07-04 DEATH — deceased

## 2021-07-03 IMAGING — DX DG CHEST 1V PORT
1 series · 1 of 1 positions shown · non-contrast
Comparison: None.

CLINICAL DATA: Cough, altered mental status

EXAM:
PORTABLE CHEST 1 VIEW

[chest ap]
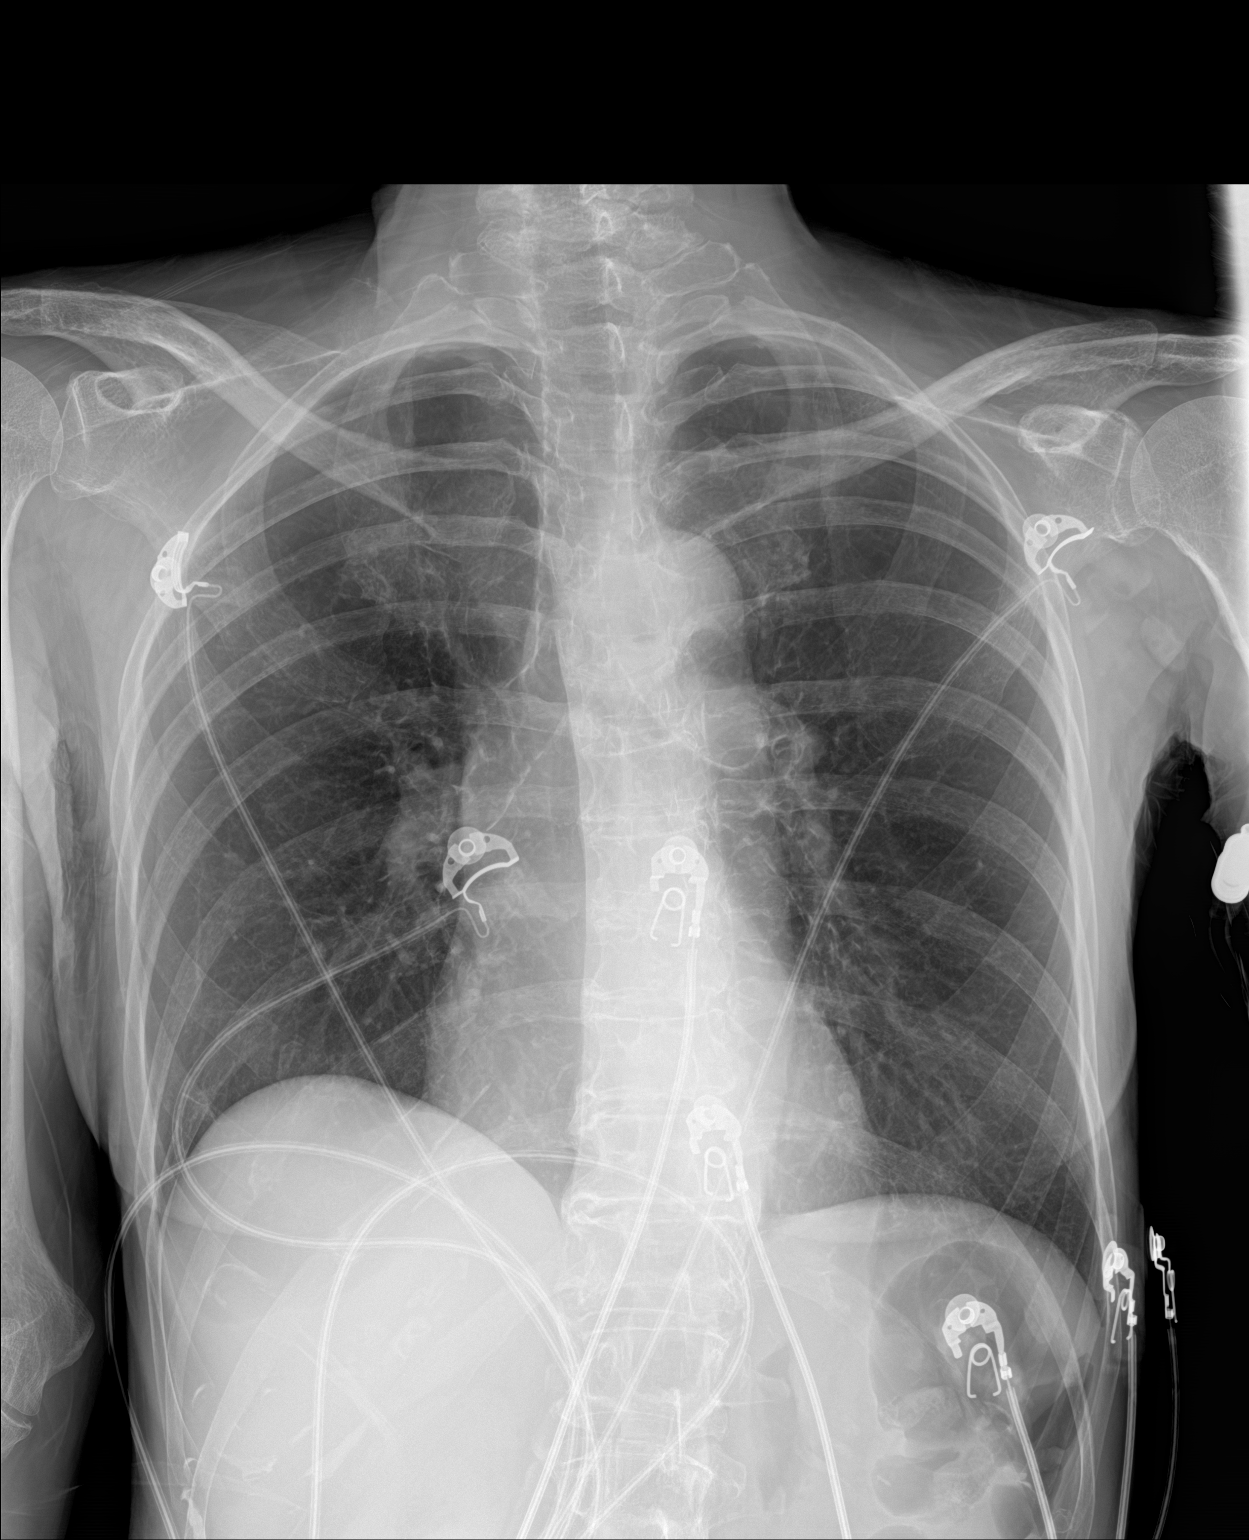

[1 of 1 positions shown; findings below may reference images not displayed]

FINDINGS: No consolidation, features of edema, pneumothorax, or effusion. The
cardiomediastinal contours are unremarkable. No acute osseous or
soft tissue abnormality. Degenerative changes are present in the
imaged spine and shoulders. Telemetry leads overlie the chest.
IMPRESSION: No acute cardiopulmonary disease.
